# Patient Record
Sex: Female | Born: 1975
Health system: Southern US, Community
[De-identification: ages and names within clinical notes are randomized; demographics above are authoritative.]

## PROBLEM LIST (undated history)

## (undated) DIAGNOSIS — F419 Anxiety disorder, unspecified: Secondary | ICD-10-CM

## (undated) DIAGNOSIS — E0500 Thyrotoxicosis with diffuse goiter without thyrotoxic crisis or storm: Secondary | ICD-10-CM

## (undated) DIAGNOSIS — Z9889 Other specified postprocedural states: Secondary | ICD-10-CM

## (undated) DIAGNOSIS — R519 Headache, unspecified: Secondary | ICD-10-CM

## (undated) DIAGNOSIS — R112 Nausea with vomiting, unspecified: Secondary | ICD-10-CM

## (undated) DIAGNOSIS — R21 Rash and other nonspecific skin eruption: Secondary | ICD-10-CM

## (undated) DIAGNOSIS — R896 Abnormal cytological findings in specimens from other organs, systems and tissues: Secondary | ICD-10-CM

## (undated) DIAGNOSIS — I10 Essential (primary) hypertension: Secondary | ICD-10-CM

## (undated) DIAGNOSIS — Z9289 Personal history of other medical treatment: Secondary | ICD-10-CM

## (undated) DIAGNOSIS — M542 Cervicalgia: Secondary | ICD-10-CM

## (undated) DIAGNOSIS — G8929 Other chronic pain: Secondary | ICD-10-CM

## (undated) DIAGNOSIS — R51 Headache: Secondary | ICD-10-CM

## (undated) DIAGNOSIS — N906 Unspecified hypertrophy of vulva: Secondary | ICD-10-CM

## (undated) DIAGNOSIS — R42 Dizziness and giddiness: Secondary | ICD-10-CM

## (undated) HISTORY — PX: BREAST ENHANCEMENT SURGERY: SHX7

## (undated) HISTORY — PX: CERVICAL CONE BIOPSY: SUR198

## (undated) HISTORY — DX: Abnormal cytological findings in specimens from other organs, systems and tissues: R89.6

---
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
## 1999-03-07 ENCOUNTER — Other Ambulatory Visit: Admission: RE | Admit: 1999-03-07 | Discharge: 1999-03-07 | Payer: Self-pay | Admitting: Obstetrics and Gynecology

## 1999-03-09 DIAGNOSIS — Z8619 Personal history of other infectious and parasitic diseases: Secondary | ICD-10-CM

## 1999-03-09 HISTORY — DX: Personal history of other infectious and parasitic diseases: Z86.19

## 1999-07-20 ENCOUNTER — Encounter: Payer: Self-pay | Admitting: *Deleted

## 1999-07-20 ENCOUNTER — Ambulatory Visit (HOSPITAL_COMMUNITY): Admission: RE | Admit: 1999-07-20 | Discharge: 1999-07-20 | Payer: Self-pay | Admitting: *Deleted

## 1999-09-24 ENCOUNTER — Inpatient Hospital Stay (HOSPITAL_COMMUNITY): Admission: AD | Admit: 1999-09-24 | Discharge: 1999-09-26 | Payer: Self-pay | Admitting: *Deleted

## 2004-03-16 ENCOUNTER — Inpatient Hospital Stay (HOSPITAL_COMMUNITY): Admission: EM | Admit: 2004-03-16 | Discharge: 2004-03-18 | Payer: Self-pay | Admitting: Emergency Medicine

## 2005-09-29 ENCOUNTER — Other Ambulatory Visit: Admission: RE | Admit: 2005-09-29 | Discharge: 2005-09-29 | Payer: Self-pay | Admitting: Obstetrics and Gynecology

## 2006-10-12 ENCOUNTER — Other Ambulatory Visit: Admission: RE | Admit: 2006-10-12 | Discharge: 2006-10-12 | Payer: Self-pay | Admitting: Obstetrics and Gynecology

## 2007-07-08 ENCOUNTER — Encounter (HOSPITAL_COMMUNITY): Admission: RE | Admit: 2007-07-08 | Discharge: 2007-08-28 | Payer: Self-pay | Admitting: Internal Medicine

## 2009-12-10 ENCOUNTER — Emergency Department (HOSPITAL_COMMUNITY): Admission: EM | Admit: 2009-12-10 | Discharge: 2009-12-10 | Payer: Self-pay | Admitting: Emergency Medicine

## 2010-11-16 ENCOUNTER — Encounter (HOSPITAL_COMMUNITY)
Admission: RE | Admit: 2010-11-16 | Discharge: 2011-01-03 | Payer: Self-pay | Source: Home / Self Care | Attending: Endocrinology | Admitting: Endocrinology

## 2010-12-26 ENCOUNTER — Encounter (HOSPITAL_BASED_OUTPATIENT_CLINIC_OR_DEPARTMENT_OTHER): Payer: Self-pay | Admitting: Internal Medicine

## 2011-01-26 ENCOUNTER — Inpatient Hospital Stay (INDEPENDENT_AMBULATORY_CARE_PROVIDER_SITE_OTHER)
Admission: RE | Admit: 2011-01-26 | Discharge: 2011-01-26 | Disposition: A | Discharge status: Home / Self Care | Payer: 59 | Source: Ambulatory Visit | Attending: Family Medicine | Admitting: Family Medicine

## 2011-01-26 DIAGNOSIS — J020 Streptococcal pharyngitis: Secondary | ICD-10-CM

## 2011-01-26 LAB — POCT RAPID STREP A (OFFICE): Streptococcus, Group A Screen (Direct): POSITIVE — AB

## 2011-02-01 ENCOUNTER — Ambulatory Visit (HOSPITAL_COMMUNITY): Payer: 59 | Attending: Internal Medicine

## 2011-02-01 ENCOUNTER — Encounter: Payer: Self-pay | Admitting: Cardiology

## 2011-02-01 ENCOUNTER — Encounter: Payer: Self-pay | Admitting: Cardiovascular Disease

## 2011-02-01 DIAGNOSIS — R072 Precordial pain: Secondary | ICD-10-CM | POA: Insufficient documentation

## 2011-02-01 DIAGNOSIS — I10 Essential (primary) hypertension: Secondary | ICD-10-CM | POA: Insufficient documentation

## 2011-02-01 DIAGNOSIS — E0500 Thyrotoxicosis with diffuse goiter without thyrotoxic crisis or storm: Secondary | ICD-10-CM | POA: Insufficient documentation

## 2011-02-13 LAB — HCG, SERUM, QUALITATIVE: Preg, Serum: NEGATIVE

## 2011-02-19 LAB — COMPREHENSIVE METABOLIC PANEL
ALT: 28 U/L (ref 0–35)
AST: 27 U/L (ref 0–37)
Albumin: 3.4 g/dL — ABNORMAL LOW (ref 3.5–5.2)
CO2: 24 mEq/L (ref 19–32)
Calcium: 8.3 mg/dL — ABNORMAL LOW (ref 8.4–10.5)
GFR calc Af Amer: >60 mL/min (ref >60)
GFR calc non Af Amer: >60 mL/min (ref >60)
Sodium: 138 mEq/L (ref 135–145)

## 2011-02-19 LAB — CBC
Hemoglobin: 13.2 g/dL (ref 12.0–15.0)
MCHC: 35 g/dL (ref 30.0–36.0)
Platelets: 192 10*3/uL (ref 150–400)
RDW: 13.2 % (ref 11.5–15.5)

## 2011-02-19 LAB — T4, FREE: Free T4: 4.08 ng/dL — ABNORMAL HIGH (ref 0.80–1.80)

## 2011-02-19 LAB — RAPID URINE DRUG SCREEN, HOSP PERFORMED
Benzodiazepines: NOT DETECTED
Tetrahydrocannabinol: POSITIVE — AB

## 2011-02-19 LAB — POCT I-STAT, CHEM 8
BUN: 21 mg/dL (ref 6–23)
Calcium, Ion: 1.16 mmol/L (ref 1.12–1.32)
Chloride: 107 mEq/L (ref 96–112)
Glucose, Bld: 205 mg/dL — ABNORMAL HIGH (ref 70–99)

## 2011-02-19 LAB — URINE MICROSCOPIC-ADD ON

## 2011-02-19 LAB — URINALYSIS, ROUTINE W REFLEX MICROSCOPIC
Bilirubin Urine: NEGATIVE
Hgb urine dipstick: NEGATIVE
Nitrite: NEGATIVE
Specific Gravity, Urine: 1.031 — ABNORMAL HIGH (ref 1.005–1.030)
pH: 5.5 (ref 5.0–8.0)

## 2011-02-19 LAB — DIFFERENTIAL
Basophils Absolute: 0 10*3/uL (ref 0.0–0.1)
Basophils Relative: 0 % (ref 0–1)
Lymphocytes Relative: 12 % (ref 12–46)
Neutro Abs: 8.1 10*3/uL — ABNORMAL HIGH (ref 1.7–7.7)
Neutrophils Relative %: 84 % — ABNORMAL HIGH (ref 43–77)

## 2011-02-19 LAB — T4: T4, Total: 15.5 ug/dL — ABNORMAL HIGH (ref 5.0–12.5)

## 2011-04-21 NOTE — H&P (Signed)
Sharon Jones, Sharon Jones NO.:  [id number]   MEDICAL RECORD NO.:  [id number]                   PATIENT TYPE:  INP   LOCATION:  1831                                 FACILITY:  [facility]   PHYSICIAN:  Lanier Ensign, M.D.            DATE OF BIRTH:  07-26-1976   DATE OF ADMISSION:  03/16/2004  DATE OF DISCHARGE:                                HISTORY & PHYSICAL   PRIMARY CARE Jermie Hippe:  Teresita Madura, M.D., presumably in [place] where  the patient lives.  GYN is Dillard Cannon, M.D.   CHIEF COMPLAINT:  Nausea and vomiting.   The patient is a 35 year old white female with a history only of  precancerous cervical cells, status post conization over the past year x2,  without other past medical history, who presents to emergency room with the  abrupt onset of nausea, vomiting, dizziness, orthostatic symptoms, minimal  brown loose stool x5 hours after eating [place] food take-out at 8 p.m.  No  other sick contacts.  Describes subjective chills, no fever, mild anxiety  shortness of breath when symptoms of dizziness come on, no chest pain, and  feeling improved after therapeutic interventions in the emergency room.   PAST MEDICAL HISTORY:  Abnormal Pap smears beginning one year ago with  precancerous cells diagnosed.  The patient was scheduled to follow up with  GYN for evaluation but has missed appointment.   PAST SURGICAL HISTORY:  Conization procedures at [facility].  Breast augmentation two years ago.   No known drug allergies.   MEDICATIONS:  None.  No oral contraceptive pills and no over-the-counter  pills.   FAMILY HISTORY:  Significant for hypertension in father, mother, and sister.   SOCIAL HISTORY:  Lives in [place] with her boyfriend and son.  Tobacco of  one pack per week of cigarettes.  Weekend alcohol consumption of  approximately one bottle of wine at a time.  No drug use.  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   Other review of systems negative.   PHYSICAL EXAMINATION:  VITAL SIGNS:  Most recent blood pressure is 120/60  lying, pulse 100, with standing 79/22, pulse 132, oxygenation 100%.  The  patient is afebrile.  GENERAL:  The patient is a well-developed, well-nourished, mildly ill-  appearing white female in no acute distress.  HEENT:  Normocephalic, atraumatic.  Pupils equal, round, and reactive to  light and accommodation.  Sclerae white.  Conjunctivae clear.  EOMs intact.  Oropharynx clear.  Mucous membranes moist.  No exudate.  No erythema.  Tympanic membranes clear bilaterally.  NECK:  Supple, no lymphadenopathy, no thyromegaly.  CARDIAC:  Tachycardic, regular rhythm.  No murmurs, rubs, or gallops.  CHEST:  Lungs clear to auscultation, good air movement, no crackles or  wheezing.  ABDOMEN:  Mild epigastric tenderness with deep palpation.  Positive bowel  sounds,  soft, nondistended, no hepatosplenomegaly.  EXTREMITIES:  Lower extremities without edema.  2+ DTRs, symmetric.  Strength 5/5, symmetric upper and lower extremities.  NEUROLOGIC:  The patient is alert and oriented, answers questions  appropriately.  Neurologic exam nonfocal.   Lab work obtained showed sodium 141, potassium 3.4, BUN 16, creatinine 0.7.  H&H 13.6 and 40.  On metabolic panel, anion gap is upper limits of normal at  12.  On urinalysis, negative except for 15 ketones.  Urine pregnancy test  negative.   ASSESSMENT AND PLAN:  1. Volume depletion and mild hypokalemia.  Continue IV fluids, replace     electrolytes, monitor orthostatics.  As patient consistently tachycardic,     will monitor on telemetry for now, likely discontinue later on today.  2. Gastroenteritis, food poisoning.  The patient's symptoms except for     orthostasis are improved currently, no nausea or vomiting.  Continue to     treat symptomatically.  Add Protonix and Phenergan for nausea.  3.  Precancerous cervical cells in past.  The patient should be encouraged to     keep follow-up appointment with GYN.                                                Lanier Ensign, M.D.    MHP/MEDQ  D:  03/16/2004  T:  03/16/2004  Job:  [id number]

## 2011-04-21 NOTE — Discharge Summary (Signed)
Sharon Jones, Sharon Jones NO.:  [id number]   MEDICAL RECORD NO.:  [id number]                   PATIENT TYPE:  INP   LOCATION:  4713                                 FACILITY:  [facility]   PHYSICIAN:  Elliot Cousin, M.D.                 DATE OF BIRTH:  June 01, 1976   DATE OF ADMISSION:  03/16/2004  DATE OF DISCHARGE:  03/18/2004                                 DISCHARGE SUMMARY   DISCHARGE DIAGNOSES:  1. C. Diff colitis.  2. Hypokalemia.  3. Normocytic anemia.  4. History of abnormal Pap smear with precancerous cervical cells, status     post conization approximately two years ago.  5. History of breast augmentation approximately two years ago.   DISCHARGE MEDICATIONS:  1. Flagyl 500 mg t.i.d. until completed.  2. Phenergan 25 mg one tablet q.4-6h as needed for nausea.  3. Multivitamin with iron one daily.   DISCHARGE DISPOSITION:  The patient was discharged to home on March 18, 2004, in improved and stable condition.  She was advised to follow up with  her primary care physician, Dr. Teresita Madura, in [place], [place],  in one week to ten days.   HISTORY OF PRESENT ILLNESS:  The patient is a 35 year old lady with a past  medical history significant for only precancerous cervical cells, status  post conization over the past two years without any other past medical  history, who presented to the emergency department on March 16, 2004, with  an abrupt onset of nausea, vomiting, dizziness, orthostatic symptoms and  diarrhea.  The patient stated that these symptoms started approximately five  hours after eating [place] food.  There have been no other sick contacts.  She describes subjective chills, however, no fever.  She denied any  hematemesis, bright red blood per rectum or melena.  The patient was unable  to keep any food down, therefore, she was admitted for further evaluation  and management.   HOSPITAL COURSE:  1. C. DIFF COLITIS.  The  initial management started with volume repletion     with normal saline at 250 cc an hour times three liters.  She was     initially kept NPO, however, shortly after admission she was advanced to     a clear liquid diet.  She was started on Protonix empirically at 40 mg IV     daily.  She was treated with Phenergan 12.5 mg IV every four hours as     needed for nausea.  She did not complain specifically of pain.  Her     initial lab results were significant for a white blood cell count of     12.4, with 11.2 from the absolute neutrophil count.  Her potassium was     3.4, however, her sodium was within normal limits at 141.  Her pregnancy     test was negative.  Her urinalysis  was negative with exception of 15     ketones.  Blood cultures and stool cultures were sent for further     evaluation.   The patient's stool became positive for C. Diff toxin.  She was, therefore,  started on Flagyl 500 mg t.i.d.  Her blood cultures remained negative during  the hospital course.  Approximately 24 hours after treatment, the patient's  nausea, vomiting and diarrhea subsided substantially.  On hospital day  number two she was advanced to a regular diet.  She had complete resolution  of the nausea, vomiting and diarrhea on hospital day number two.  She was  repleted with potassium chloride by mouth.  Her potassium prior to hospital  discharge was 3.7.  Her white blood cell count had fallen to 5.3 prior to  hospital discharge.  The only other abnormality was that the patient had  mild normocytic anemia with her hemoglobin ranging between 11.1 and 11.7  after volume repletion.  Her MCV ranged between 89 and 90.2.  She is a  menstruating female and she had no evidence of bloody stools.  The patient  will follow up with her primary care physician, Dr. Teresita Madura, in  approximately one week to ten days.  The patient was advised to complete the  course of Flagyl at 500 mg t.i.d. for a total of ten  days.                                                Elliot Cousin, M.D.    DF/MEDQ  D:  03/23/2004  T:  03/24/2004  Job:  [id number]   cc:   Teresita Madura, M.D.  [facility]  [place]

## 2011-08-19 ENCOUNTER — Encounter (HOSPITAL_COMMUNITY): Payer: 59

## 2011-10-04 ENCOUNTER — Encounter (HOSPITAL_COMMUNITY): Payer: 59

## 2011-10-14 ENCOUNTER — Inpatient Hospital Stay (HOSPITAL_COMMUNITY): Admission: RE | Admit: 2011-10-14 | Payer: 59 | Source: Ambulatory Visit

## 2011-11-25 ENCOUNTER — Encounter (HOSPITAL_COMMUNITY): Payer: 59

## 2012-04-10 ENCOUNTER — Encounter: Payer: Self-pay | Admitting: Obstetrics and Gynecology

## 2012-04-11 ENCOUNTER — Ambulatory Visit (INDEPENDENT_AMBULATORY_CARE_PROVIDER_SITE_OTHER): Payer: 59 | Admitting: Obstetrics and Gynecology

## 2012-04-11 ENCOUNTER — Encounter: Payer: Self-pay | Admitting: Obstetrics and Gynecology

## 2012-04-11 VITALS — BP 128/80 | Resp 14 | Ht 66.0 in | Wt 145.0 lb

## 2012-04-11 DIAGNOSIS — N926 Irregular menstruation, unspecified: Secondary | ICD-10-CM

## 2012-04-11 DIAGNOSIS — N390 Urinary tract infection, site not specified: Secondary | ICD-10-CM

## 2012-04-11 DIAGNOSIS — B009 Herpesviral infection, unspecified: Secondary | ICD-10-CM

## 2012-04-11 DIAGNOSIS — D280 Benign neoplasm of vulva: Secondary | ICD-10-CM

## 2012-04-11 LAB — POCT URINALYSIS DIPSTICK
Bilirubin, UA: NEGATIVE
Glucose, UA: NEGATIVE
Ketones, UA: NEGATIVE
Leukocytes, UA: NEGATIVE
Spec Grav, UA: 1.015

## 2012-04-11 NOTE — Progress Notes (Signed)
Odor: no Fever: no Pelvic Pain: no  Itching: no Dyspareunia: no Desires GC/CT: no  Thin: no History of PID: no Desires HIV,RPR,HbsAG: no  Thick: no History of STD: yes Other: pt states she has not had her cycle in 3 months and had 2 negative pregnancy test at home.    Sharon Jones is a 36 y.o. year old female,G3P1021, who presents for a problem visit.  Subjective:  The patient complains of no menstrual cycle for 3 months.  She also complains of a tender burning area at her vulva.  She has a past history of herpes simplex virus type I.she has had a Mirena IUD since December of 2008.  She has a past history of HPV.  Objective:  BP 128/80  Resp 14  Ht 5\' 6"  (1.676 m)  Wt 145 lb (65.772 kg)  BMI 23.40 kg/m2  LMP 01/30/2012   General: alert and cooperative GI: soft, non-tender; bowel sounds normal; no masses,  no organomegaly the patient appears anxious  External genitalia: no lesions appreciated except that she does have a small area that may be a condyloma Vaginal: normal without tenderness, induration or masses and relaxation noted Cervix: normal appearance and IUD string visualized Adnexa: normal bimanual exam Uterus: normal size shape and consistency  Pregnancy test: Negative  UA: Negative  Assessment:  Burning area at the vulva.  No lesions appreciated. Herpes virus type I Menorrhea secondary to her Mirena IUD Possible genital wart  Plan:  We discussed the above diagnosis.  Removal of the wart was offered. The patient elects to observe her only for now.  Return to office prn if symptoms worsen or fail to improve.   Leonard Schwartz M.D.  04/12/2012 7:21 PM

## 2012-08-06 ENCOUNTER — Emergency Department (HOSPITAL_COMMUNITY)
Admission: EM | Admit: 2012-08-06 | Discharge: 2012-08-07 | Disposition: A | Discharge status: Home / Self Care | Payer: 59 | Attending: Emergency Medicine | Admitting: Emergency Medicine

## 2012-08-06 ENCOUNTER — Encounter (HOSPITAL_COMMUNITY): Payer: Self-pay | Admitting: Emergency Medicine

## 2012-08-06 DIAGNOSIS — E0500 Thyrotoxicosis with diffuse goiter without thyrotoxic crisis or storm: Secondary | ICD-10-CM | POA: Insufficient documentation

## 2012-08-06 DIAGNOSIS — I10 Essential (primary) hypertension: Secondary | ICD-10-CM | POA: Insufficient documentation

## 2012-08-06 DIAGNOSIS — M549 Dorsalgia, unspecified: Secondary | ICD-10-CM | POA: Insufficient documentation

## 2012-08-06 DIAGNOSIS — Z87891 Personal history of nicotine dependence: Secondary | ICD-10-CM | POA: Insufficient documentation

## 2012-08-06 HISTORY — DX: Essential (primary) hypertension: I10

## 2012-08-06 HISTORY — DX: Thyrotoxicosis with diffuse goiter without thyrotoxic crisis or storm: E05.00

## 2012-08-06 MED ORDER — PREDNISONE 20 MG PO TABS: 40.0000 mg | ORAL_TABLET | Freq: Every day | ORAL | Status: AC

## 2012-08-06 MED ORDER — PREDNISONE 20 MG PO TABS
60.0000 mg | ORAL_TABLET | Freq: Once | ORAL | Status: AC
  Administered 2012-08-06: 60 mg via ORAL
  Filled 2012-08-06: qty 3

## 2012-08-06 MED ORDER — OXYCODONE-ACETAMINOPHEN 5-325 MG PO TABS: 2.0000 | ORAL_TABLET | ORAL | Status: AC | PRN

## 2012-08-06 MED ORDER — HYDROMORPHONE HCL PF 2 MG/ML IJ SOLN
2.0000 mg | Freq: Once | INTRAMUSCULAR | Status: AC
  Administered 2012-08-06: 2 mg via INTRAMUSCULAR
  Filled 2012-08-06: qty 1

## 2012-08-06 NOTE — ED Provider Notes (Signed)
History     CSN: [id number]  Arrival date & time 08/06/12  2128   First MD Initiated Contact with Patient 08/06/12 2354      Chief Complaint  Patient presents with  . Back Pain    (Consider location/radiation/quality/duration/timing/severity/associated sxs/prior treatment) Patient is a 36 y.o. female presenting with back pain. The history is provided by the patient. No language interpreter was used.  Back Pain  This is a recurrent problem. The current episode started more than 1 week ago. The problem occurs every several days. The problem has been gradually worsening. The pain is associated with no known injury. Pain location: R paraspinal lumbar. The quality of the pain is described as shooting and burning. The pain radiates to the right thigh. The pain is at a severity of 10/10. The pain is severe.   36 year old female coming in with right lower back pain with radiation into her right buttocks. States that the pain is intermittent x 3 months. States that this is the worst it has ever been though. States she has been taking ibuprofen for the pain. States she does have some numbness going down to her left thigh but no weakness to the lower extremity. Denies any injury or falls. Denies any bowel or bladder incontinence or perineal numbness.  Past Medical History  Diagnosis Date  . Abnormal finding on Pap smear, ASCUS 1997  . Graves disease   . Hypertension     Past Surgical History  Procedure Date  . Cervical cone biopsy     twice    No family history on file.  History  Substance Use Topics  . Smoking status: Former Games developer  . Smokeless tobacco: Not on file  . Alcohol Use: Yes    OB History    Grav Para Term Preterm Abortions TAB SAB Ect Mult Living   3 1 1  0 2     1      Review of Systems  Constitutional: Negative.   HENT: Negative.   Eyes: Negative.   Respiratory: Negative.   Cardiovascular: Negative.   Gastrointestinal: Negative.   Musculoskeletal: Positive for  back pain and gait problem.  Neurological: Negative.   Psychiatric/Behavioral: Negative.   All other systems reviewed and are negative.    Allergies  Review of patient's allergies indicates no known allergies.  Home Medications   Current Outpatient Rx  Name Route Sig Dispense Refill  . CYCLOBENZAPRINE HCL 10 MG PO TABS Oral Take 10 mg by mouth as needed. spasm    . IBUPROFEN 800 MG PO TABS Oral Take 800 mg by mouth every 8 (eight) hours as needed. Pain    . ISOTRETINOIN 40 MG PO CAPS Oral Take 40 mg by mouth daily.    . OXYCODONE-ACETAMINOPHEN 5-325 MG PO TABS Oral Take 2 tablets by mouth every 4 (four) hours as needed for pain. 15 tablet 0  . PREDNISONE 20 MG PO TABS Oral Take 2 tablets (40 mg total) by mouth daily. 10 tablet 0    Dispense as written.    BP 158/107  Pulse 84  Temp 98.4 F (36.9 C) (Oral)  Resp 20  Wt 140 lb (63.504 kg)  SpO2 100%  Physical Exam  Nursing note and vitals reviewed. Constitutional: She is oriented to person, place, and time. She appears well-developed and well-nourished.  HENT:  Head: Normocephalic and atraumatic.  Eyes: Conjunctivae and EOM are normal. Pupils are equal, round, and reactive to light.  Neck: Normal range of motion. Neck supple.  Cardiovascular: Normal rate.   Pulmonary/Chest: Effort normal.  Abdominal: Soft.  Musculoskeletal: Normal range of motion. She exhibits tenderness. She exhibits no edema.       Right lower back pain with sciatica.  Neurological: She is alert and oriented to person, place, and time. She has normal reflexes. No cranial nerve deficit. Coordination normal.  Skin: Skin is warm and dry.  Psychiatric: She has a normal mood and affect.    ED Course  Procedures (including critical care time)  Labs Reviewed - No data to display No results found.   1. Back pain       MDM  36 year old female with right lower back pain with radiation into the right buttocks and thigh. Having some right-sided  numbness but no perineal numbness or caught a PICC line symptoms. There is flaccid. Better after Dilaudid IM and 2 her muscles. 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 We will give her a work note for tomorrow. Rx for Percocet and prednisone. Good relief in the ER able to ambulate will followup with her orthopedic Dr. on Friday as planned.         Remi Haggard, NP 08/07/12 0004

## 2012-08-06 NOTE — ED Notes (Signed)
Pt with lower back pain which started today.  Has appointment on Friday with ortho MD for numbness in her left leg for two months.  Pt used ice today and Motrin and flexeril without relief.  No injury reported.

## 2012-08-07 NOTE — ED Provider Notes (Signed)
Medical screening examination/treatment/procedure(s) were performed by non-physician practitioner and as supervising physician I was immediately available for consultation/collaboration.  Khiana Camino, MD 08/07/12 0402 

## 2012-08-13 ENCOUNTER — Other Ambulatory Visit (HOSPITAL_COMMUNITY): Payer: Self-pay | Admitting: Orthopedic Surgery

## 2012-08-13 DIAGNOSIS — M545 Low back pain: Secondary | ICD-10-CM

## 2012-08-16 ENCOUNTER — Other Ambulatory Visit (HOSPITAL_COMMUNITY): Payer: 59

## 2012-08-19 ENCOUNTER — Ambulatory Visit (HOSPITAL_COMMUNITY)
Admission: RE | Admit: 2012-08-19 | Discharge: 2012-08-19 | Disposition: A | Discharge status: Home / Self Care | Payer: 59 | Source: Ambulatory Visit | Attending: Orthopedic Surgery | Admitting: Orthopedic Surgery

## 2012-08-19 DIAGNOSIS — M5137 Other intervertebral disc degeneration, lumbosacral region: Secondary | ICD-10-CM | POA: Insufficient documentation

## 2012-08-19 DIAGNOSIS — M545 Low back pain: Secondary | ICD-10-CM

## 2012-08-19 DIAGNOSIS — R209 Unspecified disturbances of skin sensation: Secondary | ICD-10-CM | POA: Insufficient documentation

## 2012-08-19 DIAGNOSIS — M51379 Other intervertebral disc degeneration, lumbosacral region without mention of lumbar back pain or lower extremity pain: Secondary | ICD-10-CM | POA: Insufficient documentation

## 2012-09-27 ENCOUNTER — Other Ambulatory Visit: Payer: Self-pay | Admitting: Orthopedic Surgery

## 2012-09-27 ENCOUNTER — Other Ambulatory Visit (HOSPITAL_COMMUNITY): Payer: Self-pay | Admitting: Orthopedic Surgery

## 2012-09-27 DIAGNOSIS — M545 Low back pain: Secondary | ICD-10-CM

## 2012-09-28 ENCOUNTER — Other Ambulatory Visit (HOSPITAL_COMMUNITY): Payer: 59

## 2012-09-28 ENCOUNTER — Ambulatory Visit (HOSPITAL_COMMUNITY): Admission: RE | Admit: 2012-09-28 | Payer: 59 | Source: Ambulatory Visit

## 2012-09-30 ENCOUNTER — Ambulatory Visit (HOSPITAL_COMMUNITY)
Admission: RE | Admit: 2012-09-30 | Discharge: 2012-09-30 | Disposition: A | Discharge status: Home / Self Care | Payer: 59 | Source: Ambulatory Visit | Attending: Orthopedic Surgery | Admitting: Orthopedic Surgery

## 2012-09-30 ENCOUNTER — Encounter (HOSPITAL_COMMUNITY): Payer: Self-pay | Admitting: Pharmacy Technician

## 2012-09-30 ENCOUNTER — Other Ambulatory Visit: Payer: Self-pay | Admitting: Orthopedic Surgery

## 2012-09-30 DIAGNOSIS — M545 Low back pain: Secondary | ICD-10-CM

## 2012-09-30 DIAGNOSIS — M5020 Other cervical disc displacement, unspecified cervical region: Secondary | ICD-10-CM | POA: Insufficient documentation

## 2012-09-30 MED ORDER — ASPIRIN 81 MG PO CHEW
CHEWABLE_TABLET | ORAL | Status: AC
  Filled 2012-09-30: qty 1

## 2012-10-01 ENCOUNTER — Other Ambulatory Visit: Payer: Self-pay | Admitting: Orthopedic Surgery

## 2012-10-01 ENCOUNTER — Encounter (HOSPITAL_COMMUNITY): Payer: Self-pay | Admitting: *Deleted

## 2012-10-01 NOTE — Progress Notes (Signed)
Pt doesn't have a cardiologist  Denies heart cath/echo/stress test  Denies ekg or cxr being done within the past yr

## 2012-10-02 ENCOUNTER — Ambulatory Visit (HOSPITAL_COMMUNITY): Payer: 59 | Admitting: Anesthesiology

## 2012-10-02 ENCOUNTER — Ambulatory Visit (HOSPITAL_COMMUNITY)
Admission: RE | Admit: 2012-10-02 | Discharge: 2012-10-02 | Disposition: A | Discharge status: Home / Self Care | Payer: 59 | Source: Ambulatory Visit | Attending: Orthopedic Surgery | Admitting: Orthopedic Surgery

## 2012-10-02 ENCOUNTER — Ambulatory Visit (HOSPITAL_COMMUNITY): Payer: 59

## 2012-10-02 ENCOUNTER — Encounter (HOSPITAL_COMMUNITY): Payer: Self-pay | Admitting: Anesthesiology

## 2012-10-02 ENCOUNTER — Observation Stay (HOSPITAL_COMMUNITY)
Admission: RE | Admit: 2012-10-02 | Discharge: 2012-10-03 | Disposition: A | Discharge status: Home / Self Care | Payer: 59 | Source: Ambulatory Visit | Attending: Orthopedic Surgery | Admitting: Orthopedic Surgery

## 2012-10-02 ENCOUNTER — Encounter (HOSPITAL_COMMUNITY): Payer: Self-pay

## 2012-10-02 ENCOUNTER — Encounter (HOSPITAL_COMMUNITY)
Admission: RE | Disposition: A | Discharge status: Home / Self Care | Payer: Self-pay | Source: Ambulatory Visit | Attending: Orthopedic Surgery

## 2012-10-02 ENCOUNTER — Observation Stay (HOSPITAL_COMMUNITY): Payer: 59

## 2012-10-02 DIAGNOSIS — I10 Essential (primary) hypertension: Secondary | ICD-10-CM | POA: Insufficient documentation

## 2012-10-02 DIAGNOSIS — F411 Generalized anxiety disorder: Secondary | ICD-10-CM | POA: Insufficient documentation

## 2012-10-02 DIAGNOSIS — M5000 Cervical disc disorder with myelopathy, unspecified cervical region: Principal | ICD-10-CM | POA: Insufficient documentation

## 2012-10-02 DIAGNOSIS — E0500 Thyrotoxicosis with diffuse goiter without thyrotoxic crisis or storm: Secondary | ICD-10-CM | POA: Insufficient documentation

## 2012-10-02 DIAGNOSIS — G8929 Other chronic pain: Secondary | ICD-10-CM | POA: Insufficient documentation

## 2012-10-02 HISTORY — DX: Other chronic pain: G89.29

## 2012-10-02 HISTORY — PX: ANTERIOR CERVICAL DECOMP/DISCECTOMY FUSION: SHX1161

## 2012-10-02 HISTORY — DX: Other specified postprocedural states: Z98.890

## 2012-10-02 HISTORY — DX: Dizziness and giddiness: R42

## 2012-10-02 HISTORY — DX: Cervicalgia: M54.2

## 2012-10-02 HISTORY — DX: Rash and other nonspecific skin eruption: R21

## 2012-10-02 HISTORY — DX: Nausea with vomiting, unspecified: R11.2

## 2012-10-02 HISTORY — DX: Anxiety disorder, unspecified: F41.9

## 2012-10-02 HISTORY — DX: Personal history of other medical treatment: Z92.89

## 2012-10-02 LAB — URINALYSIS, ROUTINE W REFLEX MICROSCOPIC
Glucose, UA: NEGATIVE mg/dL
Ketones, ur: NEGATIVE mg/dL
Leukocytes, UA: NEGATIVE
Protein, ur: NEGATIVE mg/dL
Urobilinogen, UA: 0.2 mg/dL (ref 0.0–1.0)

## 2012-10-02 LAB — CBC WITH DIFFERENTIAL/PLATELET
Basophils Absolute: 0 10*3/uL (ref 0.0–0.1)
Basophils Relative: 0 % (ref 0–1)
Eosinophils Absolute: 0.2 10*3/uL (ref 0.0–0.7)
Eosinophils Relative: 3 % (ref 0–5)
HCT: 39.6 % (ref 36.0–46.0)
MCHC: 34.8 g/dL (ref 30.0–36.0)
MCV: 90 fL (ref 78.0–100.0)
Monocytes Absolute: 0.5 10*3/uL (ref 0.1–1.0)
Platelets: 264 10*3/uL (ref 150–400)
RDW: 12.3 % (ref 11.5–15.5)
WBC: 8 10*3/uL (ref 4.0–10.5)

## 2012-10-02 LAB — COMPREHENSIVE METABOLIC PANEL
ALT: 18 U/L (ref 0–35)
AST: 18 U/L (ref 0–37)
Albumin: 4.3 g/dL (ref 3.5–5.2)
CO2: 25 mEq/L (ref 19–32)
Calcium: 9.6 mg/dL (ref 8.4–10.5)
Creatinine, Ser: 0.6 mg/dL (ref 0.50–1.10)
GFR calc non Af Amer: >90 mL/min (ref >90)
Sodium: 139 mEq/L (ref 135–145)
Total Protein: 7.8 g/dL (ref 6.0–8.3)

## 2012-10-02 LAB — TYPE AND SCREEN
ABO/RH(D): A POS
Antibody Screen: NEGATIVE

## 2012-10-02 LAB — URINE MICROSCOPIC-ADD ON

## 2012-10-02 LAB — PROTIME-INR: INR: 1.04 (ref 0.00–1.49)

## 2012-10-02 LAB — ABO/RH: ABO/RH(D): A POS

## 2012-10-02 SURGERY — ANTERIOR CERVICAL DECOMPRESSION/DISCECTOMY FUSION 1 LEVEL
Anesthesia: General | Site: Spine Cervical | Laterality: Bilateral | Wound class: Clean

## 2012-10-02 MED ORDER — PROPOFOL INFUSION 10 MG/ML OPTIME
INTRAVENOUS | Status: DC | PRN
  Administered 2012-10-02: 100 ug/kg/min via INTRAVENOUS

## 2012-10-02 MED ORDER — DEXTROSE 5 % IV SOLN
INTRAVENOUS | Status: DC | PRN
  Administered 2012-10-02: 15:00:00 via INTRAVENOUS

## 2012-10-02 MED ORDER — MUPIROCIN 2 % EX OINT
TOPICAL_OINTMENT | CUTANEOUS | Status: AC
  Administered 2012-10-02: 1 via NASAL
  Filled 2012-10-02: qty 22

## 2012-10-02 MED ORDER — CEFAZOLIN SODIUM-DEXTROSE 2-3 GM-% IV SOLR: 2.0000 g | INTRAVENOUS | Status: DC

## 2012-10-02 MED ORDER — ISOTRETINOIN 40 MG PO CAPS: 40.0000 mg | ORAL_CAPSULE | Freq: Every day | ORAL | Status: DC

## 2012-10-02 MED ORDER — SUCCINYLCHOLINE CHLORIDE 20 MG/ML IJ SOLN
INTRAMUSCULAR | Status: DC | PRN
  Administered 2012-10-02: 120 mg via INTRAVENOUS

## 2012-10-02 MED ORDER — SODIUM CHLORIDE 0.9 % IV SOLN: 250.0000 mL | INTRAVENOUS | Status: DC

## 2012-10-02 MED ORDER — MENTHOL 3 MG MT LOZG: 1.0000 | LOZENGE | OROMUCOSAL | Status: DC | PRN

## 2012-10-02 MED ORDER — CEFAZOLIN SODIUM-DEXTROSE 2-3 GM-% IV SOLR
INTRAVENOUS | Status: AC
  Filled 2012-10-02: qty 50

## 2012-10-02 MED ORDER — OXYCODONE HCL 5 MG PO TABS: 5.0000 mg | ORAL_TABLET | Freq: Once | ORAL | Status: DC | PRN

## 2012-10-02 MED ORDER — LACTATED RINGERS IV SOLN
INTRAVENOUS | Status: DC | PRN
  Administered 2012-10-02 (×2): via INTRAVENOUS

## 2012-10-02 MED ORDER — BUPIVACAINE-EPINEPHRINE PF 0.25-1:200000 % IJ SOLN
INTRAMUSCULAR | Status: AC
  Filled 2012-10-02: qty 30

## 2012-10-02 MED ORDER — PHENOL 1.4 % MT LIQD
1.0000 | OROMUCOSAL | Status: DC | PRN
  Filled 2012-10-02: qty 177

## 2012-10-02 MED ORDER — CEFAZOLIN SODIUM-DEXTROSE 2-3 GM-% IV SOLR
2.0000 g | INTRAVENOUS | Status: AC
  Administered 2012-10-02: 2 g via INTRAVENOUS
  Filled 2012-10-02: qty 50

## 2012-10-02 MED ORDER — OXYCODONE-ACETAMINOPHEN 5-325 MG PO TABS
1.0000 | ORAL_TABLET | ORAL | Status: DC | PRN
  Administered 2012-10-02 – 2012-10-03 (×2): 2 via ORAL
  Filled 2012-10-02 (×2): qty 2

## 2012-10-02 MED ORDER — POVIDONE-IODINE 7.5 % EX SOLN
Freq: Once | CUTANEOUS | Status: DC
  Filled 2012-10-02: qty 118

## 2012-10-02 MED ORDER — HYDROMORPHONE HCL PF 1 MG/ML IJ SOLN
0.2500 mg | INTRAMUSCULAR | Status: DC | PRN
  Administered 2012-10-02 (×2): 0.5 mg via INTRAVENOUS

## 2012-10-02 MED ORDER — LIDOCAINE HCL (CARDIAC) 20 MG/ML IV SOLN: INTRAVENOUS | Status: DC | PRN

## 2012-10-02 MED ORDER — ONDANSETRON HCL 4 MG/2ML IJ SOLN: 4.0000 mg | INTRAMUSCULAR | Status: DC | PRN

## 2012-10-02 MED ORDER — OXYCODONE HCL 5 MG/5ML PO SOLN: 5.0000 mg | Freq: Once | ORAL | Status: DC | PRN

## 2012-10-02 MED ORDER — CEFAZOLIN SODIUM 1-5 GM-% IV SOLN
1.0000 g | Freq: Three times a day (TID) | INTRAVENOUS | Status: AC
  Administered 2012-10-02 – 2012-10-03 (×2): 1 g via INTRAVENOUS
  Filled 2012-10-02 (×2): qty 50

## 2012-10-02 MED ORDER — METOCLOPRAMIDE HCL 5 MG/ML IJ SOLN
INTRAMUSCULAR | Status: DC | PRN
  Administered 2012-10-02: 10 mg via INTRAVENOUS

## 2012-10-02 MED ORDER — PROMETHAZINE HCL 25 MG/ML IJ SOLN: 6.2500 mg | INTRAMUSCULAR | Status: DC | PRN

## 2012-10-02 MED ORDER — LIDOCAINE HCL (CARDIAC) 20 MG/ML IV SOLN
INTRAVENOUS | Status: DC | PRN
  Administered 2012-10-02: 50 mg via INTRAVENOUS

## 2012-10-02 MED ORDER — POVIDONE-IODINE 7.5 % EX SOLN: Freq: Once | CUTANEOUS | Status: DC

## 2012-10-02 MED ORDER — PROPOFOL 10 MG/ML IV BOLUS
INTRAVENOUS | Status: DC | PRN
  Administered 2012-10-02: 200 mg via INTRAVENOUS

## 2012-10-02 MED ORDER — DEXAMETHASONE SODIUM PHOSPHATE 4 MG/ML IJ SOLN
INTRAMUSCULAR | Status: DC | PRN
  Administered 2012-10-02: 12 mg via INTRAVENOUS

## 2012-10-02 MED ORDER — MORPHINE SULFATE 2 MG/ML IJ SOLN
2.0000 mg | INTRAMUSCULAR | Status: DC | PRN
  Administered 2012-10-02 – 2012-10-03 (×2): 2 mg via INTRAVENOUS
  Filled 2012-10-02 (×2): qty 1

## 2012-10-02 MED ORDER — THROMBIN 20000 UNITS EX SOLR
OROMUCOSAL | Status: DC | PRN
  Administered 2012-10-02: 16:00:00 via TOPICAL

## 2012-10-02 MED ORDER — ACETAMINOPHEN 325 MG PO TABS: 650.0000 mg | ORAL_TABLET | ORAL | Status: DC | PRN

## 2012-10-02 MED ORDER — SODIUM CHLORIDE 0.9 % IJ SOLN: 3.0000 mL | Freq: Two times a day (BID) | INTRAMUSCULAR | Status: DC

## 2012-10-02 MED ORDER — 0.9 % SODIUM CHLORIDE (POUR BTL) OPTIME
TOPICAL | Status: DC | PRN
  Administered 2012-10-02: 1000 mL

## 2012-10-02 MED ORDER — ALUM & MAG HYDROXIDE-SIMETH 200-200-20 MG/5ML PO SUSP: 30.0000 mL | Freq: Four times a day (QID) | ORAL | Status: DC | PRN

## 2012-10-02 MED ORDER — DOCUSATE SODIUM 100 MG PO CAPS
100.0000 mg | ORAL_CAPSULE | Freq: Two times a day (BID) | ORAL | Status: DC
  Filled 2012-10-02 (×2): qty 1

## 2012-10-02 MED ORDER — SENNA 8.6 MG PO TABS
1.0000 | ORAL_TABLET | Freq: Two times a day (BID) | ORAL | Status: DC
  Filled 2012-10-02 (×3): qty 1

## 2012-10-02 MED ORDER — BUPIVACAINE-EPINEPHRINE 0.25% -1:200000 IJ SOLN
INTRAMUSCULAR | Status: DC | PRN
  Administered 2012-10-02: 2 mL

## 2012-10-02 MED ORDER — LACTATED RINGERS IV SOLN
INTRAVENOUS | Status: DC
  Administered 2012-10-02: 15:00:00 via INTRAVENOUS

## 2012-10-02 MED ORDER — DIAZEPAM 5 MG PO TABS: 5.0000 mg | ORAL_TABLET | Freq: Four times a day (QID) | ORAL | Status: DC | PRN

## 2012-10-02 MED ORDER — POTASSIUM CHLORIDE IN NACL 20-0.9 MEQ/L-% IV SOLN
INTRAVENOUS | Status: DC
  Administered 2012-10-02: 80 mL/h via INTRAVENOUS
  Filled 2012-10-02 (×3): qty 1000

## 2012-10-02 MED ORDER — ZOLPIDEM TARTRATE 5 MG PO TABS
5.0000 mg | ORAL_TABLET | Freq: Every evening | ORAL | Status: DC | PRN
  Administered 2012-10-03: 5 mg via ORAL
  Filled 2012-10-02: qty 1

## 2012-10-02 MED ORDER — SODIUM CHLORIDE 0.9 % IJ SOLN: 3.0000 mL | INTRAMUSCULAR | Status: DC | PRN

## 2012-10-02 MED ORDER — ACETAMINOPHEN 650 MG RE SUPP: 650.0000 mg | RECTAL | Status: DC | PRN

## 2012-10-02 MED ORDER — THROMBIN 20000 UNITS EX SOLR
CUTANEOUS | Status: AC
  Filled 2012-10-02: qty 20000

## 2012-10-02 MED ORDER — HYDROMORPHONE HCL PF 1 MG/ML IJ SOLN
INTRAMUSCULAR | Status: AC
  Filled 2012-10-02: qty 1

## 2012-10-02 MED ORDER — ONDANSETRON HCL 4 MG/2ML IJ SOLN
INTRAMUSCULAR | Status: DC | PRN
  Administered 2012-10-02: 4 mg via INTRAVENOUS

## 2012-10-02 MED ORDER — MIDAZOLAM HCL 5 MG/5ML IJ SOLN
INTRAMUSCULAR | Status: DC | PRN
  Administered 2012-10-02 (×2): 2 mg via INTRAVENOUS

## 2012-10-02 MED ORDER — ARTIFICIAL TEARS OP OINT
TOPICAL_OINTMENT | OPHTHALMIC | Status: DC | PRN
  Administered 2012-10-02: 1 via OPHTHALMIC

## 2012-10-02 MED ORDER — FENTANYL CITRATE 0.05 MG/ML IJ SOLN
INTRAMUSCULAR | Status: DC | PRN
  Administered 2012-10-02: 150 ug via INTRAVENOUS
  Administered 2012-10-02 (×4): 50 ug via INTRAVENOUS
  Administered 2012-10-02 (×2): 100 ug via INTRAVENOUS

## 2012-10-02 SURGICAL SUPPLY — 71 items
APL SKNCLS STERI-STRIP NONHPOA (GAUZE/BANDAGES/DRESSINGS) ×1
BENZOIN TINCTURE PRP APPL 2/3 (GAUZE/BANDAGES/DRESSINGS) ×2 IMPLANT
BIT DRILL NEURO 2X3.1 SFT TUCH (MISCELLANEOUS) ×1 IMPLANT
BLADE SURG 15 STRL LF DISP TIS (BLADE) ×1 IMPLANT
BLADE SURG 15 STRL SS (BLADE) ×2
BLADE SURG ROTATE 9660 (MISCELLANEOUS) ×2 IMPLANT
BUR MATCHSTICK NEURO 3.0 LAGG (BURR) ×2 IMPLANT
CAGE 16X6X14 ENDOSKEL MED (Orthopedic Implant) IMPLANT
CARTRIDGE OIL MAESTRO DRILL (MISCELLANEOUS) ×1 IMPLANT
CLOTH BEACON ORANGE TIMEOUT ST (SAFETY) ×2 IMPLANT
CLSR STERI-STRIP ANTIMIC 1/2X4 (GAUZE/BANDAGES/DRESSINGS) ×1 IMPLANT
COLLAR CERV LO CONTOUR FIRM DE (SOFTGOODS) IMPLANT
CORDS BIPOLAR (ELECTRODE) ×2 IMPLANT
COVER SURGICAL LIGHT HANDLE (MISCELLANEOUS) ×2 IMPLANT
CRADLE DONUT ADULT HEAD (MISCELLANEOUS) ×2 IMPLANT
DIFFUSER DRILL AIR PNEUMATIC (MISCELLANEOUS) ×2 IMPLANT
DRAIN JACKSON RD 7FR 3/32 (WOUND CARE) IMPLANT
DRAPE C-ARM 42X72 X-RAY (DRAPES) ×2 IMPLANT
DRAPE POUCH INSTRU U-SHP 10X18 (DRAPES) ×2 IMPLANT
DRAPE SURG 17X23 STRL (DRAPES) ×6 IMPLANT
DRILL NEURO 2X3.1 SOFT TOUCH (MISCELLANEOUS) ×2
DURAPREP 26ML APPLICATOR (WOUND CARE) ×2 IMPLANT
ELECT COATED BLADE 2.86 ST (ELECTRODE) ×2 IMPLANT
ELECT REM PT RETURN 9FT ADLT (ELECTROSURGICAL) ×2
ELECTRODE REM PT RTRN 9FT ADLT (ELECTROSURGICAL) ×1 IMPLANT
ENDOSKELETON IMPLANT MED 6M-0 (Orthopedic Implant) ×2 IMPLANT
EVACUATOR SILICONE 100CC (DRAIN) IMPLANT
GAUZE SPONGE 4X4 16PLY XRAY LF (GAUZE/BANDAGES/DRESSINGS) ×2 IMPLANT
GLOVE BIO SURGEON STRL SZ8 (GLOVE) ×2 IMPLANT
GLOVE BIOGEL PI IND STRL 8 (GLOVE) ×1 IMPLANT
GLOVE BIOGEL PI INDICATOR 8 (GLOVE) ×1
GOWN SRG XL XLNG 56XLVL 4 (GOWN DISPOSABLE) ×1 IMPLANT
GOWN STRL NON-REIN LRG LVL3 (GOWN DISPOSABLE) ×2 IMPLANT
GOWN STRL NON-REIN XL XLG LVL4 (GOWN DISPOSABLE) ×2
IV CATH 14GX2 1/4 (CATHETERS) ×2 IMPLANT
KIT BASIN OR (CUSTOM PROCEDURE TRAY) ×2 IMPLANT
KIT ROOM TURNOVER OR (KITS) ×2 IMPLANT
MANIFOLD NEPTUNE II (INSTRUMENTS) ×2 IMPLANT
NDL SPNL 20GX3.5 QUINCKE YW (NEEDLE) ×1 IMPLANT
NEEDLE 27GAX1X1/2 (NEEDLE) ×2 IMPLANT
NEEDLE SPNL 20GX3.5 QUINCKE YW (NEEDLE) ×2 IMPLANT
NS IRRIG 1000ML POUR BTL (IV SOLUTION) ×2 IMPLANT
OIL CARTRIDGE MAESTRO DRILL (MISCELLANEOUS) ×2
PACK ORTHO CERVICAL (CUSTOM PROCEDURE TRAY) ×2 IMPLANT
PAD ARMBOARD 7.5X6 YLW CONV (MISCELLANEOUS) ×4 IMPLANT
PATTIES SURGICAL .5 X.5 (GAUZE/BANDAGES/DRESSINGS) ×1 IMPLANT
PATTIES SURGICAL .5 X1 (DISPOSABLE) ×1 IMPLANT
PIN DISTRACTION 14 (PIN) ×2 IMPLANT
PLATE LV 1 12MM (Plate) ×1 IMPLANT
PUTTY BONE DBX 2.5 MIS (Bone Implant) ×1 IMPLANT
SCREW 4.0X16MM (Screw) ×4 IMPLANT
SPONGE GAUZE 4X4 12PLY (GAUZE/BANDAGES/DRESSINGS) ×2 IMPLANT
SPONGE INTESTINAL PEANUT (DISPOSABLE) ×2 IMPLANT
SPONGE SURGIFOAM ABS GEL 100 (HEMOSTASIS) ×1 IMPLANT
STRIP CLOSURE SKIN 1/2X4 (GAUZE/BANDAGES/DRESSINGS) ×2 IMPLANT
SURGIFLO TRUKIT (HEMOSTASIS) IMPLANT
SUT MNCRL AB 4-0 PS2 18 (SUTURE) ×1 IMPLANT
SUT SILK 4 0 (SUTURE)
SUT SILK 4-0 18XBRD TIE 12 (SUTURE) IMPLANT
SUT VIC AB 1 CT1 27 (SUTURE) ×2
SUT VIC AB 1 CT1 27XBRD ANBCTR (SUTURE) ×1 IMPLANT
SUT VIC AB 2-0 CT2 18 VCP726D (SUTURE) ×2 IMPLANT
SYR BULB IRRIGATION 50ML (SYRINGE) ×2 IMPLANT
SYR CONTROL 10ML LL (SYRINGE) ×4 IMPLANT
TAPE CLOTH 4X10 WHT NS (GAUZE/BANDAGES/DRESSINGS) ×2 IMPLANT
TAPE CLOTH SURG 4X10 WHT LF (GAUZE/BANDAGES/DRESSINGS) ×1 IMPLANT
TAPE UMBILICAL COTTON 1/8X30 (MISCELLANEOUS) ×2 IMPLANT
TOWEL OR 17X24 6PK STRL BLUE (TOWEL DISPOSABLE) ×2 IMPLANT
TOWEL OR 17X26 10 PK STRL BLUE (TOWEL DISPOSABLE) ×2 IMPLANT
WATER STERILE IRR 1000ML POUR (IV SOLUTION) ×2 IMPLANT
YANKAUER SUCT BULB TIP NO VENT (SUCTIONS) ×2 IMPLANT

## 2012-10-02 NOTE — Preoperative (Signed)
Beta Blockers   Reason not to administer Beta Blockers:Not Applicable 

## 2012-10-02 NOTE — Anesthesia Procedure Notes (Addendum)
Procedure Name: Intubation Date/Time: 10/02/2012 3:15 PM Performed by: Wray Kearns A Pre-anesthesia Checklist: Patient identified, Timeout performed, Emergency Drugs available, Suction available and Patient being monitored Patient Re-evaluated:Patient Re-evaluated prior to inductionOxygen Delivery Method: Circle system utilized Preoxygenation: Pre-oxygenation with 100% oxygen Intubation Type: IV induction and Cricoid Pressure applied Ventilation: Mask ventilation without difficulty Laryngoscope Size: Mac Grade View: Grade I Tube type: Oral Number of attempts: 1 Airway Equipment and Method: Stylet and Video-laryngoscopy Placement Confirmation: ETT inserted through vocal cords under direct vision,  breath sounds checked- equal and bilateral and positive ETCO2 Secured at: 22 cm Tube secured with: Tape Dental Injury: Teeth and Oropharynx as per pre-operative assessment  Comments: In-line manual traction during induction per Dr. Lenore Cordia neck in neutral position; same as pre-op when Pt stated she was comfortable.

## 2012-10-02 NOTE — Transfer of Care (Signed)
Immediate Anesthesia Transfer of Care Note  Patient: Sharon Jones  Procedure(s) Performed: Procedure(s) (LRB) with comments: ANTERIOR CERVICAL DECOMPRESSION/DISCECTOMY FUSION 1 LEVEL (Bilateral) - Anterior cervical decompression fusion, 5-6 with instrumentation, allograft.  Patient Location: PACU  Anesthesia Type:General  Level of Consciousness: awake and alert   Airway & Oxygen Therapy: Patient Spontanous Breathing and Patient connected to nasal cannula oxygen  Post-op Assessment: Report given to PACU RN and Post -op Vital signs reviewed and stable  Post vital signs: Reviewed and stable  Complications: No apparent anesthesia complications

## 2012-10-02 NOTE — Anesthesia Preprocedure Evaluation (Addendum)
Anesthesia Evaluation  Patient identified by MRN, date of birth, ID band Patient awake    Reviewed: Allergy & Precautions, H&P , NPO status , Patient's Chart, lab work & pertinent test results, reviewed documented beta blocker date and time   History of Anesthesia Complications (+) PONV  Airway Mallampati: II TM Distance: >3 FB Neck ROM: Full    Dental  (+) Teeth Intact and Dental Advisory Given   Pulmonary neg pulmonary ROS,    Pulmonary exam normal       Cardiovascular hypertension,     Neuro/Psych  Headaches, Anxiety    GI/Hepatic negative GI ROS, Neg liver ROS,   Endo/Other  Hyperthyroidism   Renal/GU negative Renal ROS     Musculoskeletal   Abdominal   Peds  Hematology negative hematology ROS (+)   Anesthesia Other Findings   Reproductive/Obstetrics                          Anesthesia Physical Anesthesia Plan  ASA: III and Emergent  Anesthesia Plan: General   Post-op Pain Management:    Induction: Intravenous  Airway Management Planned: Video Laryngoscope Planned  Additional Equipment:   Intra-op Plan:   Post-operative Plan: Extubation in OR  Informed Consent: I have reviewed the patients History and Physical, chart, labs and discussed the procedure including the risks, benefits and alternatives for the proposed anesthesia with the patient or authorized representative who has indicated his/her understanding and acceptance.   Dental advisory given  Plan Discussed with: CRNA, Anesthesiologist and Surgeon  Anesthesia Plan Comments:         Anesthesia Quick Evaluation

## 2012-10-02 NOTE — Progress Notes (Signed)
Call to Dr. Krista Blue- reported BP & pt. History of noncompliance to anti-htn med.  Reported also that pt had ECHO /w Dr. Elease Hashimoto 2 + yrs. Ago, that I had called [facility] for the record & I was still waiting for fax.

## 2012-10-02 NOTE — H&P (Signed)
PREOPERATIVE H&P  Chief Complaint: balance deterioration  HPI: Sharon Jones is a 36 y.o. female who presents with deterioration in balance and fine motor skills  Past Medical History  Diagnosis Date  . Abnormal finding on Pap smear, ASCUS 1997  . Graves disease   . PONV (postoperative nausea and vomiting)   . Hypertension     doesn't require meds  . Headache     occaionally  . Dizziness   . Chronic neck pain     bulding disc  . Rash     arms and feet  . Muscle spasms of head and/or neck     takes Flexeril prn  . Anxiety     doesn't require meds   Past Surgical History  Procedure Date  . Cervical cone biopsy     twice  . Breast enhancement surgery    History   Social History  . Marital Status: Single    Spouse Name: N/A    Number of Children: N/A  . Years of Education: N/A   Social History Main Topics  . Smoking status: Former Games developer  . Smokeless tobacco: None   Comment: quit in mar 2013-uses EC cigarette  . Alcohol Use: Yes     more frequently than occaionally  . Drug Use: No  . Sexually Active: Yes    Birth Control/ Protection: IUD   Other Topics Concern  . None   Social History Narrative  . None   History reviewed. No pertinent family history. No Known Allergies Prior to Admission medications   Medication Sig Start Date End Date Taking? Authorizing Provider  cyclobenzaprine (FLEXERIL) 10 MG tablet Take 10 mg by mouth 2 (two) times daily as needed. For muscle spasms.   Yes Historical Provider, MD  ISOtretinoin (CLARAVIS) 40 MG capsule Take 40 mg by mouth daily.   Yes Historical Provider, MD     All other systems have been reviewed and were otherwise negative with the exception of those mentioned in the HPI and as above.  Physical Exam: There were no vitals filed for this visit.  General: Alert, no acute distress Cardiovascular: No pedal edema Respiratory: No cyanosis, no use of accessory musculature GI: No organomegaly, abdomen is soft and  non-tender Skin: No lesions in the area of chief complaint Neurologic: Sensation intact distally Psychiatric: Patient is competent for consent with normal mood and affect Lymphatic: No axillary or cervical lymphadenopathy  MUSCULOSKELETAL: + hoffman's bilaterally, hyperreflexia, + rhomberg  Assessment/Plan: Profound cervical myelopathy with very large C5/6 CNP Plan for Procedure(s): ANTERIOR CERVICAL DECOMPRESSION/DISCECTOMY FUSION 1 LEVEL   Emilee Hero, MD 10/02/2012 6:31 AM

## 2012-10-02 NOTE — Progress Notes (Signed)
Pt.'s BP elevated, pt.  Reports that she has been given Benicar in the past but pt. reports, " I fill it & take it when I feel the buzzing".  Pt. Reports that she hasn't taken it in about a couple of weeks.

## 2012-10-02 NOTE — Anesthesia Postprocedure Evaluation (Signed)
  Anesthesia Post-op Note  Patient: Sharon Jones  Procedure(s) Performed: Procedure(s) (LRB) with comments: ANTERIOR CERVICAL DECOMPRESSION/DISCECTOMY FUSION 1 LEVEL (Bilateral) - Anterior cervical decompression fusion, 5-6 with instrumentation, allograft.  Patient Location: PACU  Anesthesia Type:General  Level of Consciousness: awake  Airway and Oxygen Therapy: Patient Spontanous Breathing  Post-op Pain: mild  Post-op Assessment: Post-op Vital signs reviewed  Post-op Vital Signs: Reviewed  Complications: No apparent anesthesia complications

## 2012-10-02 NOTE — Anesthesia Postprocedure Evaluation (Signed)
  Anesthesia Post-op Note  Patient: Sharon Jones  Procedure(s) Performed: Procedure(s) (LRB) with comments: ANTERIOR CERVICAL DECOMPRESSION/DISCECTOMY FUSION 1 LEVEL (Bilateral) - Anterior cervical decompression fusion, 5-6 with instrumentation, allograft.  Patient Location: PACU  Anesthesia Type:General  Level of Consciousness: awake  Airway and Oxygen Therapy: Patient Spontanous Breathing and Patient connected to nasal cannula oxygen  Post-op Pain: mild  Post-op Assessment: Post-op Vital signs reviewed, Patient's Cardiovascular Status Stable, Respiratory Function Stable, Patent Airway and No signs of Nausea or vomiting  Post-op Vital Signs: Reviewed and stable  Complications: No apparent anesthesia complications

## 2012-10-03 ENCOUNTER — Encounter (HOSPITAL_COMMUNITY): Payer: Self-pay | Admitting: Orthopedic Surgery

## 2012-10-03 NOTE — Progress Notes (Signed)
Patient doing well, minimal neck pain.  BP 141/109  Pulse 101  Temp 98.4 F (36.9 C) (Oral)  Resp 18  Ht 5\' 5"  (1.651 m)  Wt 65.6 kg (144 lb 10 oz)  BMI 24.07 kg/m2  SpO2 99%  LMP 03/02/2012  NVI Dressing CDI  Pt s/p C5/6 acdf for large disc herniation and myelopathy  - d/c home today - f/u 2 weeks - hard collar at all times - pt advised to f/u with PCP regarding HTN

## 2012-10-03 NOTE — Progress Notes (Signed)
Orthopedic Tech Progress Note Patient Details:  Sharon Jones [birth year] [id number] Nursing discharge orders called for [place] collar. Collar delivered and given to patient with instructions to wear during showers. Ortho Devices Type of Ortho Device: [place] cervical collar Ortho Device/Splint Interventions: Ordered   [place] R Thompson 10/03/2012, 9:40 AM

## 2012-10-03 NOTE — Op Note (Signed)
Sharon Jones, Sharon Jones NO.:  [id number]  MEDICAL RECORD NO.:  [id number]  LOCATION:  5N29C                        FACILITY:  [facility]  PHYSICIAN:  Estill Bamberg, MD      DATE OF BIRTH:  [birth year]  DATE OF PROCEDURE:  10/02/2012 DATE OF DISCHARGE:  10/03/2012                              OPERATIVE REPORT   PREOPERATIVE DIAGNOSES: 1. Severe spinal cord compression with profound symptoms and profound     exam notable for significant myelopathy. 2. Large C5-6 disk herniation causing severe compression of     myelomalacia of the spinal cord.  POSTOPERATIVE DIAGNOSES: 1. Severe spinal cord compression with profound symptoms and profound     exam notable for significant myelopathy. 2. Large C5-6 disk herniation causing severe compression of     myelomalacia of the spinal cord.  PROCEDURES: 1. C5-6 anterior cervical decompression and fusion. 2. Placement of anterior instrumentation C5-6. 3. Insertion of interbody device x1 (small 6 mm parallel tight and     interbody cage). 4. Use of local autograft. 5. Use of morselized allograft. 6. Intraoperative use of fluoroscopy.  SURGEON:  Estill Bamberg, MD  ASSISTANT:  None.  ANESTHESIA:  General endotracheal anesthesia.  COMPLICATIONS:  None.  DISPOSITION:  Stable.  ESTIMATED BLOOD LOSS:  Minimal.  INDICATIONS FOR PROCEDURE:  Briefly, Sharon Jones is a pleasant 36 year old female who presented to me with severe deterioration in her balance and fine motor skills.  Her exam was very much consistent with myelopathy. I did obtain an urgent MRI of her cervical spine which was notable for a very large C5-6 disk herniation causing obvious and severe compression of the spinal cord.  Given the natural history of myelopathy, I did discuss with the patient regarding going forward with a C5-6 ACDF instrumentation.  The patient fully understood the risks and limitations of the procedure.  Of particular note, the patient did  understand that the goal of surgery was not to reverse her constellation of symptoms, but to prevent additional progression.  The patient also did understand the risk of spinal cord injury.  OPERATIVE DETAILS:  On October 02, 2012, the patient was brought to the Surgery and general endotracheal anesthesia was administered.  Of note, a fiberoptic intubation was utilized to ensure that there was no extension of the neck.  Baseline motor-evoked potentials were obtained. The neck was then placed in a gentle degree of extension and the neck was prepped and draped in the usual sterile fashion.  The arms were secured to the patient's sides.  I then obtained an additional motor evoked potential and this was consistent with baseline.  The neck was then prepped and draped in the usual sterile fashion.  I then made a transverse incision over the C5-6 interspace.  The platysma was sharply incised.  The plane between the sternocleidomastoid muscle laterally and the strap muscles medially that was explored, and the anterior cervical spine was readily noted.  I then obtained an additional intraoperative lateral fluoroscopic view to confirm the appropriate operative level.  A vertebral bodies of C5 and C6  approximately exposed.  I then placed Caspar pins into the C5 and C6 vertebral bodies and gentle distraction  was applied across the C5-6 interspace.  I then went forward with a meticulous diskectomy using a series of curettes and Kerrison punches. On with the posterior border of the C5 and C6 vertebral bodies, it was obvious that there was ongoing compression of the spinal cord with residual disk protrusions posteriorly.  I did meticulously using a micro nerve hook to tease away these protrusions and I was ultimately able to remove the entirety of the posterior aspect of the intervertebral disk. The anterior dura was noted and was noted to be free of compression.  I did significantly undermined the  posterior aspect of the C5 and C6 vertebral bodies, as there was residual disk material located behind the vertebral bodies.  I was able to confirmatory obtain a decompression of the spinal canal.  I then prepared the endplates and placed a series of trials and I did select a 6 mm small parallel trial which was packed with DBX mix.  Autograft obtained for removing osteophytes anteriorly, and were also packed into the interbody device and this was tamped into position at under distraction.  Distraction was then discontinued and the Caspar pins were removed.  Bone wax was placed into place.  I then chose a 12 mm Synthes Vectra plate.  A 16 mm self-drilling, self-tapping screws were placed, 2 in each vertebral body.  I was very pleased with the final purchase of each of the screws.  The wound was then copiously irrigated.  All bleeding was controlled using bipolar electrocautery. The platysma was then closed using 2-0 Vicryl.  The skin was then closed using 3-0 Monocryl.  Benzoin and Steri-Strips for applied followed by sterile dressing.  All instrument counts were correct at the termination of the procedure.  Of note, motor-evoked potentials were performed throughout the surgery intermittently and there was no change in the baseline throughout the surgery.     Estill Bamberg, MD     MD/MEDQ  D:  10/02/2012  T:  10/03/2012  Job:  [id number]  cc:   Barry Dienes. Eloise Harman, M.D.

## 2012-10-04 NOTE — Discharge Summary (Signed)
Patient ID: Sharon Jones MRN: [id number] DOB/AGE: Feb 13, 1976 36 y.o.  Admit date: 10/02/2012 Discharge date: 10/03/2012  Admission Diagnoses:  myelopathy  Discharge Diagnoses:  Same  Past Medical History  Diagnosis Date  . Abnormal finding on Pap smear, ASCUS 1997  . Graves disease   . PONV (postoperative nausea and vomiting)   . Hypertension     doesn't require meds  . Headache     occaionally  . Dizziness   . Chronic neck pain     bulding disc  . Rash     arms and feet  . Muscle spasms of head and/or neck     takes Flexeril prn  . Anxiety     doesn't require meds  . H/O echocardiogram     pt. consulted /w Dr. Elease Hashimoto, 2 + yrs. ago, had ECHO due to Graves disease.     Surgeries: Procedure(s): ANTERIOR CERVICAL DECOMPRESSION/DISCECTOMY FUSION 1 LEVEL on 10/02/2012   Discharged Condition: Improved  Hospital Course: Sharon Jones is an 36 y.o. female who was admitted 10/02/2012 for operative treatment of cervical myelopathy. Patient has severe unremitting pain that affects sleep, daily activities, and work/hobbies. After pre-op clearance the patient was taken to the operating room on 10/02/2012 and underwent  Procedure(s): ANTERIOR CERVICAL DECOMPRESSION/DISCECTOMY FUSION 1 LEVEL.    Patient was given perioperative antibiotics: Anti-infectives     Start     Dose/Rate Route Frequency Ordered Stop   10/03/12 0600   ceFAZolin (ANCEF) IVPB 2 g/50 mL premix        2 g 100 mL/hr over 30 Minutes Intravenous 60 min pre-op 10/02/12 1325 10/02/12 1505   10/02/12 2200   ceFAZolin (ANCEF) IVPB 1 g/50 mL premix        1 g 100 mL/hr over 30 Minutes Intravenous Every 8 hours 10/02/12 2012 10/03/12 0609   10/02/12 1249   ceFAZolin (ANCEF) 2-3 GM-% IVPB SOLR     Comments: WILHELM, JENNIFER: cabinet override         10/02/12 1249 10/03/12 0059   10/02/12 1248   ceFAZolin (ANCEF) IVPB 2 g/50 mL premix  Status:  Discontinued        2 g 100 mL/hr over 30 Minutes Intravenous 60  min pre-op 10/02/12 1248 10/02/12 1326           Patient was given sequential compression devices, early ambulation to prevent DVT.  Patient benefited maximally from hospital stay and there were no complications.    Recent vital signs: No data found.    Recent laboratory studies:  Basename 10/02/12 1307  WBC 8.0  HGB 13.8  HCT 39.6  PLT 264  NA 139  K 4.3  CL 104  CO2 25  BUN 18  CREATININE 0.60  GLUCOSE 99  INR 1.04  CALCIUM 9.6     Discharge Medications:     Medication List     As of 10/04/2012  3:03 PM    STOP taking these medications         cyclobenzaprine 10 MG tablet   Commonly known as: FLEXERIL      TAKE these medications         CLARAVIS 40 MG capsule   Generic drug: ISOtretinoin   Take 40 mg by mouth daily.        Diagnostic Studies: Dg Chest 2 View  10/02/2012  *RADIOLOGY REPORT*  Clinical Data: Preop.  Hypertension.  CHEST - 2 VIEW  Comparison: None  Findings: Heart and mediastinal contours are within normal limits.  No focal opacities or effusions.  No acute bony abnormality.  IMPRESSION: No active cardiopulmonary disease.   Original Report Authenticated By: Cyndie Chime, M.D.    Dg Cervical Spine 1 View  10/02/2012  *RADIOLOGY REPORT*  Clinical Data: C5-6 fusion  DG CERVICAL SPINE - 1 VIEW,DG C-ARM 1-60 MIN  Comparison: 09/30/2012, 10/02/2012  Findings: Single lateral spot fluoroscopic intraoperative view demonstrates anterior cervical discectomy with disc spacer at C5-6. Normal alignment.  IMPRESSION: Expected appearance status post C5-6 ACDF.   Original Report Authenticated By: Judie Petit. Ruel Favors, M.D.    X-ray Cervical Spine Ap And Lateral  10/02/2012  *RADIOLOGY REPORT*  Clinical Data: Preoperative exam.  CERVICAL SPINE - 2-3 VIEW  Comparison: 09/30/2012 MR.  Findings: Reversal of the normal cervical lordosis.  Degenerative changes C5-6 with anterior osteophyte.  IMPRESSION: Reversal of the normal cervical lordosis.  Degenerative changes  C5-6 with anterior osteophyte.   Original Report Authenticated By: Fuller Canada, M.D.    Mr Cervical Spine Wo Contrast  09/30/2012  *RADIOLOGY REPORT*  Clinical Data: 35 year old female with neck pain, bilateral upper extremity numbness, vertigo.  MRI CERVICAL SPINE WITHOUT CONTRAST  Technique:  Multiplanar and multiecho pulse sequences of the cervical spine, to include the craniocervical junction and cervicothoracic junction, were obtained according to standard protocol without intravenous contrast.  Comparison: None.  Findings: Straightening of cervical lordosis.  Mild endplate edema at U9-W1 appears to be degenerative. No acute osseous abnormality identified.  Cervicomedullary junction is within normal limits.  Visualized paraspinal soft tissues are within normal limits.  Abnormal spinal cord compression and signal at C5-C6 related to the large disc herniation (see below).  Abnormal signal extends from the mid to upper C5 vertebral body level to the mid to lower C6 vertebral body level.  Cord compression is severe. Elsewhere the visualize spinal cord signal and morphology is within normal limits.  C2-C3:  Negative.  C3-C4:  Mild facet and uncovertebral hypertrophy.  Mild left C4 foraminal stenosis.  C4-C5:  Negative.  C5-C6:  Loss of disc height and circumferential disc bulge with superimposed large central disc extrusion.  Mostly cephalad migration of disc material.  Severe spinal stenosis, severe cord compression, associated cord signal abnormality as above.  Mild left and mild to moderate right C6 foraminal stenosis, appears mostly related to disc.  C6-C7:  Trace caudal migration of disc or less likely thickening of the posterior longitudinal ligament extends from the above level to this disc space level.  Minimal disc bulge otherwise.  No significant spinal stenosis.  Mild facet hypertrophy with no foraminal stenosis.  C7-T1:  Mild to moderate facet hypertrophy.  Otherwise negative.  Negative  visualized upper thoracic levels except for mild facet hypertrophy.  IMPRESSION: 1.  C5-C6 spinal cord compression and abnormal spinal cord signal, presumably edema, related to a large C5-C6 disc herniation as above. Some involvement of the right C6 foramen suspected. 2.  Mild cervical degenerative changes elsewhere.  Findings discussed with Dr. Marshell Levan Nurse [place]ian Co at 1318 hours on 09/30/2012, who arranged for an immediate follow up appointment with Dr. Yevette Edwards.   Original Report Authenticated By: Harley Hallmark, M.D.    Dg C-arm 1-60 Min  10/02/2012  *RADIOLOGY REPORT*  Clinical Data: C5-6 fusion  DG CERVICAL SPINE - 1 VIEW,DG C-ARM 1-60 MIN  Comparison: 09/30/2012, 10/02/2012  Findings: Single lateral spot fluoroscopic intraoperative view demonstrates anterior cervical discectomy with disc spacer at C5-6. Normal alignment.  IMPRESSION: Expected appearance status post C5-6 ACDF.   Original  Report Authenticated By: Judie Petit. Ruel Favors, M.D.     Disposition: 01-Home or Self Care       Signed: Emilee Hero 10/04/2012, 3:03 PM

## 2013-02-19 ENCOUNTER — Telehealth: Payer: Self-pay | Admitting: Obstetrics and Gynecology

## 2013-02-20 ENCOUNTER — Other Ambulatory Visit: Payer: Self-pay | Admitting: Obstetrics and Gynecology

## 2013-02-21 LAB — GC/CHLAMYDIA PROBE AMP: GC Probe RNA: NEGATIVE

## 2013-02-22 LAB — URINE CULTURE: Colony Count: >=100000

## 2013-05-11 ENCOUNTER — Encounter (HOSPITAL_COMMUNITY): Payer: Self-pay | Admitting: Emergency Medicine

## 2013-05-11 ENCOUNTER — Emergency Department (HOSPITAL_COMMUNITY)
Admission: EM | Admit: 2013-05-11 | Discharge: 2013-05-11 | Disposition: A | Discharge status: Home / Self Care | Payer: 59 | Attending: Emergency Medicine | Admitting: Emergency Medicine

## 2013-05-11 ENCOUNTER — Emergency Department (HOSPITAL_COMMUNITY): Payer: 59

## 2013-05-11 DIAGNOSIS — I10 Essential (primary) hypertension: Secondary | ICD-10-CM | POA: Insufficient documentation

## 2013-05-11 DIAGNOSIS — F411 Generalized anxiety disorder: Secondary | ICD-10-CM | POA: Insufficient documentation

## 2013-05-11 DIAGNOSIS — S0101XA Laceration without foreign body of scalp, initial encounter: Secondary | ICD-10-CM

## 2013-05-11 DIAGNOSIS — M542 Cervicalgia: Secondary | ICD-10-CM | POA: Insufficient documentation

## 2013-05-11 DIAGNOSIS — Z87891 Personal history of nicotine dependence: Secondary | ICD-10-CM | POA: Insufficient documentation

## 2013-05-11 DIAGNOSIS — Z8679 Personal history of other diseases of the circulatory system: Secondary | ICD-10-CM | POA: Insufficient documentation

## 2013-05-11 DIAGNOSIS — R000 Tachycardia, unspecified: Secondary | ICD-10-CM | POA: Insufficient documentation

## 2013-05-11 DIAGNOSIS — Z9189 Other specified personal risk factors, not elsewhere classified: Secondary | ICD-10-CM | POA: Insufficient documentation

## 2013-05-11 DIAGNOSIS — Z8742 Personal history of other diseases of the female genital tract: Secondary | ICD-10-CM | POA: Insufficient documentation

## 2013-05-11 DIAGNOSIS — S0100XA Unspecified open wound of scalp, initial encounter: Secondary | ICD-10-CM | POA: Insufficient documentation

## 2013-05-11 DIAGNOSIS — E0500 Thyrotoxicosis with diffuse goiter without thyrotoxic crisis or storm: Secondary | ICD-10-CM | POA: Insufficient documentation

## 2013-05-11 MED ORDER — IBUPROFEN 800 MG PO TABS
800.0000 mg | ORAL_TABLET | Freq: Once | ORAL | Status: DC
  Filled 2013-05-11: qty 1

## 2013-05-11 NOTE — ED Provider Notes (Signed)
Medical screening examination/treatment/procedure(s) were performed by non-physician practitioner and as supervising physician I was immediately available for consultation/collaboration.  Geoffery Lyons, MD 05/11/13 1248

## 2013-05-11 NOTE — ED Notes (Signed)
Patient discharged to home with family. NAD. Patient refused motrin.
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
## 2013-05-12 ENCOUNTER — Telehealth (HOSPITAL_COMMUNITY): Payer: Self-pay | Admitting: Emergency Medicine

## 2013-11-20 ENCOUNTER — Encounter: Payer: Self-pay | Admitting: Emergency Medicine

## 2013-11-20 ENCOUNTER — Emergency Department
Admission: EM | Admit: 2013-11-20 | Discharge: 2013-11-20 | Disposition: A | Discharge status: Home / Self Care | Payer: 59 | Source: Home / Self Care | Attending: Family Medicine | Admitting: Family Medicine

## 2013-11-20 DIAGNOSIS — J029 Acute pharyngitis, unspecified: Secondary | ICD-10-CM

## 2013-11-20 MED ORDER — BENZONATATE 100 MG PO CAPS: 100.0000 mg | ORAL_CAPSULE | Freq: Three times a day (TID) | ORAL | Status: DC | PRN

## 2013-11-20 NOTE — ED Provider Notes (Signed)
CSN: [id number]     Arrival date & time 11/20/13  1044 History   First MD Initiated Contact with Patient 11/20/13 1046     Chief Complaint  Patient presents with  . Sore Throat  . Cough    HPI  URI Symptoms Onset: 2-3 days  Description: rhinorrhea, nasal congestion, cough, chest congestion  Modifying factors:  Hx/o strep in the past.   Symptoms Nasal discharge: yes Fever: no Sore throat: yes Cough: yes Wheezing: no Ear pain: no GI symptoms: no Sick contacts: yes  Red Flags  Stiff neck: no Dyspnea: no Rash: no Swallowing difficulty: no  Sinusitis Risk Factors Headache/face pain: no Double sickening: no tooth pain: no  Allergy Risk Factors Sneezing: no Itchy scratchy throat: no Seasonal symptoms: no  Flu Risk Factors Headache: no muscle aches: no severe fatigue: no   Past Medical History  Diagnosis Date  . Abnormal finding on Pap smear, ASCUS 1997  . Graves disease   . PONV (postoperative nausea and vomiting)   . Hypertension     doesn't require meds  . Headache(784.0)     occaionally  . Dizziness   . Chronic neck pain     bulding disc  . Rash     arms and feet  . Muscle spasms of head and/or neck     takes Flexeril prn  . Anxiety     doesn't require meds  . H/O echocardiogram     pt. consulted /w Dr. Elease Hashimoto, 2 + yrs. ago, had ECHO due to Graves disease.    Past Surgical History  Procedure Laterality Date  . Cervical cone biopsy      twice  . Breast enhancement surgery    . Anterior cervical decomp/discectomy fusion  10/02/2012    Procedure: ANTERIOR CERVICAL DECOMPRESSION/DISCECTOMY FUSION 1 LEVEL;  Surgeon: Emilee Hero, MD;  Location: [facility] OR;  Service: Orthopedics;  Laterality: Bilateral;  Anterior cervical decompression fusion, 5-6 with instrumentation, allograft.   Family History  Problem Relation Age of Onset  . Hypertension Mother    History  Substance Use Topics  . Smoking status: Former Games developer  . Smokeless tobacco:  Never Used     Comment: quit in mar 2013-uses EC cigarette  . Alcohol Use: Yes     Comment: more frequently than occaionally   OB History   Grav Para Term Preterm Abortions TAB SAB Ect Mult Living   3 1 1  0 2     1     Review of Systems  All other systems reviewed and are negative.    Allergies  Review of patient's allergies indicates no known allergies.  Home Medications  No current outpatient prescriptions on file. BP 146/106  Pulse 95  Temp(Src) 98.9 F (37.2 C) (Tympanic)  Resp 16  Ht 5\' 6"  (1.676 m)  Wt 139 lb (63.05 kg)  BMI 22.45 kg/m2  SpO2 99% Physical Exam  Constitutional: She is oriented to person, place, and time. She appears well-developed and well-nourished.  HENT:  Head: Normocephalic and atraumatic.  Right Ear: External ear normal.  Left Ear: External ear normal.  +nasal erythema, rhinorrhea bilaterally, + post oropharyngeal erythema    Eyes: Conjunctivae are normal. Pupils are equal, round, and reactive to light.  Neck: Normal range of motion. Neck supple.  Cardiovascular: Normal rate and regular rhythm.   Pulmonary/Chest: Effort normal and breath sounds normal.  Abdominal: Soft.  Musculoskeletal: Normal range of motion.  Lymphadenopathy:    She has cervical adenopathy.  Neurological:  She is alert and oriented to person, place, and time.  Skin: Skin is warm.    ED Course  Procedures (including critical care time) Labs Review Labs Reviewed  POCT RAPID STREP A (OFFICE)   Imaging Review No results found.  EKG Interpretation    Date/Time:    Ventricular Rate:    PR Interval:    QRS Duration:   QT Interval:    QTC Calculation:   R Axis:     Text Interpretation:              MDM   1. Acute pharyngitis    Likely viral process Rapid strep negative Will culture given hx/o strep in the past.  Discussed supportive care and infectious/resp red flags.  Follow up as needed.     The patient and/or caregiver has been counseled  thoroughly with regard to treatment plan and/or medications prescribed including dosage, schedule, interactions, rationale for use, and possible side effects and they verbalize understanding. Diagnoses and expected course of recovery discussed and will return if not improved as expected or if the condition worsens. Patient and/or caregiver verbalized understanding.         Doree Albee, MD 11/20/13 [phone]

## 2013-11-20 NOTE — ED Notes (Signed)
Sharon Jones c/o sore throat, HA and productive cough and congestion x 2 days. She has had a flu vac this year. Hx of strep.

## 2013-11-21 LAB — STREP A DNA PROBE: GASP: NEGATIVE

## 2013-11-22 ENCOUNTER — Telehealth: Payer: Self-pay

## 2013-11-22 NOTE — ED Notes (Signed)
Unable to reach patient.

## 2014-10-01 ENCOUNTER — Encounter (HOSPITAL_COMMUNITY): Payer: Self-pay | Admitting: Pharmacist

## 2014-10-05 ENCOUNTER — Encounter: Payer: Self-pay | Admitting: Emergency Medicine

## 2014-10-06 ENCOUNTER — Encounter (HOSPITAL_COMMUNITY): Payer: Self-pay | Admitting: *Deleted

## 2014-10-08 ENCOUNTER — Encounter (HOSPITAL_COMMUNITY): Payer: Self-pay | Admitting: Obstetrics and Gynecology

## 2014-10-08 DIAGNOSIS — N906 Unspecified hypertrophy of vulva: Secondary | ICD-10-CM | POA: Diagnosis present

## 2014-10-08 HISTORY — DX: Unspecified hypertrophy of vulva: N90.60

## 2014-10-08 NOTE — H&P (Signed)
Sharon Jones is an 38 y.o. female. G1P1 for labia plasty due to hypertrophic vulva - symptomatic - discomfort with tight clothing and dyspareunia  Pertinent Gynecological History: Menses: irregular with IUD Contraception: IUD Sexually transmitted diseases: no past historyrisk:32110} Last pap: normal, abn in past, CKC x 2 OB History: G1P1001   Menstrual History:  No LMP recorded. Patient is not currently having periods (Reason: IUD).    Past Medical History  Diagnosis Date  . Abnormal finding on Pap smear, ASCUS 1997  . Graves disease   . PONV (postoperative nausea and vomiting)   . Hypertension     doesn't require meds  . Dizziness   . Chronic neck pain     bulding disc  . Rash     arms and feet  . Anxiety     doesn't require meds  . H/O echocardiogram     pt. consulted /w Dr. Acie Fredrickson, 2 + yrs. ago, had ECHO due to Graves disease.   . SVD (spontaneous vaginal delivery)     x 1  . Headache     otc med prn  . Hypertrophy, vulva 10/08/2014    Past Surgical History  Procedure Laterality Date  . Cervical cone biopsy      twice  . Breast enhancement surgery    . Anterior cervical decomp/discectomy fusion  10/02/2012    Procedure: ANTERIOR CERVICAL DECOMPRESSION/DISCECTOMY FUSION 1 LEVEL;  Surgeon: Sinclair Ship, MD;  Location: [facility];  Service: Orthopedics;  Laterality: Bilateral;  Anterior cervical decompression fusion, 5-6 with instrumentation, allograft.    Family History  Problem Relation Age of Onset  . Hypertension Mother     Social History:  reports that she quit smoking about 3 years ago. Her smoking use included Cigarettes. She smoked 0.00 packs per day. She has never used smokeless tobacco. Uses Ecigs.  She reports that she drinks alcohol. She reports that she does not use illicit drugs.  Nurse tech, single   Allergies: No Known Allergies  Meds: Ativan, Flexeril, Epiduo, Mirena    Review of Systems  Constitutional: Negative.   HENT: Negative.    Eyes: Negative.   Respiratory: Negative.   Cardiovascular: Negative.   Gastrointestinal: Negative.   Genitourinary: Negative.   Musculoskeletal: Negative.   Skin: Negative.   Neurological: Negative.   Psychiatric/Behavioral: Negative.     Height 5\' 6"  (1.676 m), weight 65.772 kg (145 lb). Physical Exam  Constitutional: She is oriented to person, place, and time. She appears well-developed and well-nourished.  HENT:  Head: Normocephalic and atraumatic.  Cardiovascular: Normal rate and regular rhythm.   Respiratory: Effort normal and breath sounds normal. No respiratory distress. She has no wheezes.  GI: Soft. Bowel sounds are normal. She exhibits no distension. There is no tenderness.  Genitourinary:  Hypertrophic labia  Musculoskeletal: Normal range of motion.  Neurological: She is alert and oriented to person, place, and time.  Skin: Skin is warm and dry.  Psychiatric: She has a normal mood and affect. Her behavior is normal.    No results found for this or any previous visit (from the past 24 hour(s)).  No results found.  Assessment/Plan: 38yo G1P1001 with hypertrophic labia for labiaplasty D/w pt r/b/a of surgical treatment Pt voices understanding, wishes to proceed  Bovard-Stuckert, Addasyn Mcbreen 10/08/2014, 10:21 PM

## 2014-10-09 ENCOUNTER — Ambulatory Visit (HOSPITAL_COMMUNITY)
Admission: RE | Admit: 2014-10-09 | Discharge: 2014-10-09 | Disposition: A | Discharge status: Home / Self Care | Payer: 59 | Source: Ambulatory Visit | Attending: Obstetrics and Gynecology | Admitting: Obstetrics and Gynecology

## 2014-10-09 ENCOUNTER — Encounter (HOSPITAL_COMMUNITY)
Admission: RE | Disposition: A | Discharge status: Home / Self Care | Payer: Self-pay | Source: Ambulatory Visit | Attending: Obstetrics and Gynecology

## 2014-10-09 ENCOUNTER — Ambulatory Visit (HOSPITAL_COMMUNITY): Payer: 59 | Admitting: Anesthesiology

## 2014-10-09 ENCOUNTER — Encounter (HOSPITAL_COMMUNITY): Payer: Self-pay | Admitting: Anesthesiology

## 2014-10-09 DIAGNOSIS — N941 Dyspareunia: Secondary | ICD-10-CM | POA: Insufficient documentation

## 2014-10-09 DIAGNOSIS — M542 Cervicalgia: Secondary | ICD-10-CM | POA: Insufficient documentation

## 2014-10-09 DIAGNOSIS — I10 Essential (primary) hypertension: Secondary | ICD-10-CM | POA: Diagnosis not present

## 2014-10-09 DIAGNOSIS — E0500 Thyrotoxicosis with diffuse goiter without thyrotoxic crisis or storm: Secondary | ICD-10-CM | POA: Insufficient documentation

## 2014-10-09 DIAGNOSIS — G8929 Other chronic pain: Secondary | ICD-10-CM | POA: Diagnosis not present

## 2014-10-09 DIAGNOSIS — N906 Unspecified hypertrophy of vulva: Secondary | ICD-10-CM | POA: Diagnosis present

## 2014-10-09 DIAGNOSIS — Z79899 Other long term (current) drug therapy: Secondary | ICD-10-CM | POA: Diagnosis not present

## 2014-10-09 DIAGNOSIS — F419 Anxiety disorder, unspecified: Secondary | ICD-10-CM | POA: Diagnosis not present

## 2014-10-09 DIAGNOSIS — Z87891 Personal history of nicotine dependence: Secondary | ICD-10-CM | POA: Diagnosis not present

## 2014-10-09 HISTORY — DX: Headache, unspecified: R51.9

## 2014-10-09 HISTORY — DX: Unspecified hypertrophy of vulva: N90.60

## 2014-10-09 HISTORY — DX: Headache: R51

## 2014-10-09 HISTORY — PX: LABIOPLASTY: SHX1900

## 2014-10-09 LAB — CBC
HEMATOCRIT: 37.9 % (ref 36.0–46.0)
Hemoglobin: 13.2 g/dL (ref 12.0–15.0)
MCH: 32.2 pg (ref 26.0–34.0)
MCHC: 34.8 g/dL (ref 30.0–36.0)
MCV: 92.4 fL (ref 78.0–100.0)
Platelets: 241 10*3/uL (ref 150–400)
RBC: 4.1 MIL/uL (ref 3.87–5.11)
RDW: 13.1 % (ref 11.5–15.5)
WBC: 8 10*3/uL (ref 4.0–10.5)

## 2014-10-09 LAB — PREGNANCY, URINE: Preg Test, Ur: NEGATIVE

## 2014-10-09 SURGERY — LABIAPLASTY, VULVA
Anesthesia: General | Site: Vagina

## 2014-10-09 MED ORDER — MIDAZOLAM HCL 2 MG/2ML IJ SOLN
INTRAMUSCULAR | Status: AC
  Filled 2014-10-09: qty 2

## 2014-10-09 MED ORDER — PROMETHAZINE HCL 25 MG/ML IJ SOLN: 6.2500 mg | INTRAMUSCULAR | Status: DC | PRN

## 2014-10-09 MED ORDER — MIDAZOLAM HCL 2 MG/2ML IJ SOLN
INTRAMUSCULAR | Status: DC | PRN
  Administered 2014-10-09: 2 mg via INTRAVENOUS

## 2014-10-09 MED ORDER — BUPIVACAINE-EPINEPHRINE (PF) 0.25% -1:200000 IJ SOLN
INTRAMUSCULAR | Status: AC
  Filled 2014-10-09: qty 30

## 2014-10-09 MED ORDER — IBUPROFEN 800 MG PO TABS: 800.0000 mg | ORAL_TABLET | Freq: Three times a day (TID) | ORAL | Status: DC | PRN

## 2014-10-09 MED ORDER — MEPERIDINE HCL 25 MG/ML IJ SOLN
6.2500 mg | INTRAMUSCULAR | Status: DC | PRN
Start: 2014-10-09 — End: 2014-10-09

## 2014-10-09 MED ORDER — OXYCODONE-ACETAMINOPHEN 5-325 MG PO TABS: 1.0000 | ORAL_TABLET | Freq: Four times a day (QID) | ORAL | Status: DC | PRN

## 2014-10-09 MED ORDER — BUPIVACAINE-EPINEPHRINE 0.25% -1:200000 IJ SOLN
INTRAMUSCULAR | Status: DC | PRN
  Administered 2014-10-09: 30 mL

## 2014-10-09 MED ORDER — LIDOCAINE-EPINEPHRINE (PF) 1 %-1:200000 IJ SOLN
INTRAMUSCULAR | Status: AC
  Filled 2014-10-09: qty 10

## 2014-10-09 MED ORDER — DEXAMETHASONE SODIUM PHOSPHATE 10 MG/ML IJ SOLN
INTRAMUSCULAR | Status: AC
  Filled 2014-10-09: qty 1

## 2014-10-09 MED ORDER — LACTATED RINGERS IV SOLN
INTRAVENOUS | Status: DC
  Administered 2014-10-09 (×2): via INTRAVENOUS

## 2014-10-09 MED ORDER — LIDOCAINE HCL (CARDIAC) 20 MG/ML IV SOLN
INTRAVENOUS | Status: DC | PRN
  Administered 2014-10-09: 80 mg via INTRAVENOUS

## 2014-10-09 MED ORDER — FENTANYL CITRATE 0.05 MG/ML IJ SOLN
INTRAMUSCULAR | Status: AC
  Filled 2014-10-09: qty 5

## 2014-10-09 MED ORDER — SILVER SULFADIAZINE 1 % EX CREA
TOPICAL_CREAM | CUTANEOUS | Status: DC | PRN
  Administered 2014-10-09: 1 via TOPICAL

## 2014-10-09 MED ORDER — HYDROMORPHONE HCL 1 MG/ML IJ SOLN
INTRAMUSCULAR | Status: DC | PRN
  Administered 2014-10-09 (×2): 0.5 mg via INTRAVENOUS

## 2014-10-09 MED ORDER — ONDANSETRON HCL 4 MG/2ML IJ SOLN
INTRAMUSCULAR | Status: DC | PRN
  Administered 2014-10-09: 4 mg via INTRAVENOUS

## 2014-10-09 MED ORDER — KETOROLAC TROMETHAMINE 30 MG/ML IJ SOLN
INTRAMUSCULAR | Status: DC | PRN
  Administered 2014-10-09: 30 mg via INTRAVENOUS

## 2014-10-09 MED ORDER — PROPOFOL 10 MG/ML IV BOLUS
INTRAVENOUS | Status: DC | PRN
  Administered 2014-10-09: 200 mg via INTRAVENOUS

## 2014-10-09 MED ORDER — FENTANYL CITRATE 0.05 MG/ML IJ SOLN
25.0000 ug | INTRAMUSCULAR | Status: DC | PRN
  Administered 2014-10-09 (×2): 50 ug via INTRAVENOUS

## 2014-10-09 MED ORDER — SCOPOLAMINE 1 MG/3DAYS TD PT72
1.0000 | MEDICATED_PATCH | Freq: Once | TRANSDERMAL | Status: DC
  Administered 2014-10-09: 1.5 mg via TRANSDERMAL

## 2014-10-09 MED ORDER — CEFAZOLIN SODIUM-DEXTROSE 2-3 GM-% IV SOLR
2.0000 g | INTRAVENOUS | Status: AC
  Administered 2014-10-09: 2 g via INTRAVENOUS

## 2014-10-09 MED ORDER — LACTATED RINGERS IV SOLN: INTRAVENOUS | Status: DC

## 2014-10-09 MED ORDER — DEXAMETHASONE SODIUM PHOSPHATE 4 MG/ML IJ SOLN
INTRAMUSCULAR | Status: DC | PRN
  Administered 2014-10-09: 4 mg via INTRAVENOUS

## 2014-10-09 MED ORDER — HYDROMORPHONE HCL 1 MG/ML IJ SOLN
INTRAMUSCULAR | Status: AC
  Filled 2014-10-09: qty 1

## 2014-10-09 MED ORDER — SILVER SULFADIAZINE 1 % EX CREA
TOPICAL_CREAM | CUTANEOUS | Status: AC
  Filled 2014-10-09: qty 50

## 2014-10-09 MED ORDER — CEFAZOLIN SODIUM-DEXTROSE 2-3 GM-% IV SOLR
INTRAVENOUS | Status: AC
  Filled 2014-10-09: qty 50

## 2014-10-09 MED ORDER — SCOPOLAMINE 1 MG/3DAYS TD PT72
MEDICATED_PATCH | TRANSDERMAL | Status: AC
  Administered 2014-10-09: 1.5 mg via TRANSDERMAL
  Filled 2014-10-09: qty 1

## 2014-10-09 MED ORDER — GLYCOPYRROLATE 0.2 MG/ML IJ SOLN
INTRAMUSCULAR | Status: DC | PRN
  Administered 2014-10-09: 0.1 mg via INTRAVENOUS

## 2014-10-09 MED ORDER — PROPOFOL 10 MG/ML IV EMUL
INTRAVENOUS | Status: AC
  Filled 2014-10-09: qty 20

## 2014-10-09 MED ORDER — FENTANYL CITRATE 0.05 MG/ML IJ SOLN
INTRAMUSCULAR | Status: DC | PRN
  Administered 2014-10-09: 50 ug via INTRAVENOUS
  Administered 2014-10-09: 100 ug via INTRAVENOUS
  Administered 2014-10-09 (×4): 25 ug via INTRAVENOUS

## 2014-10-09 MED ORDER — FENTANYL CITRATE 0.05 MG/ML IJ SOLN
INTRAMUSCULAR | Status: AC
  Administered 2014-10-09: 50 ug via INTRAVENOUS
  Filled 2014-10-09: qty 2

## 2014-10-09 MED ORDER — LIDOCAINE HCL (CARDIAC) 20 MG/ML IV SOLN
INTRAVENOUS | Status: AC
  Filled 2014-10-09: qty 5

## 2014-10-09 MED ORDER — KETOROLAC TROMETHAMINE 30 MG/ML IJ SOLN: 15.0000 mg | Freq: Once | INTRAMUSCULAR | Status: DC | PRN

## 2014-10-09 MED ORDER — ONDANSETRON HCL 4 MG/2ML IJ SOLN
INTRAMUSCULAR | Status: AC
  Filled 2014-10-09: qty 2

## 2014-10-09 SURGICAL SUPPLY — 18 items
BLADE 15 SAFETY STRL DISP (BLADE) ×3 IMPLANT
CATH ROBINSON RED A/P 16FR (CATHETERS) ×3 IMPLANT
CLOTH BEACON ORANGE TIMEOUT ST (SAFETY) ×3 IMPLANT
COUNTER NEEDLE 1200 MAGNETIC (NEEDLE) IMPLANT
ELECT REM PT RETURN 9FT ADLT (ELECTROSURGICAL) ×3
ELECTRODE REM PT RTRN 9FT ADLT (ELECTROSURGICAL) ×1 IMPLANT
GLOVE BIO SURGEON STRL SZ 6.5 (GLOVE) ×2 IMPLANT
GLOVE BIO SURGEONS STRL SZ 6.5 (GLOVE) ×1
GOWN STRL REUS W/TWL LRG LVL3 (GOWN DISPOSABLE) ×6 IMPLANT
NEEDLE HYPO 22GX1.5 SAFETY (NEEDLE) ×3 IMPLANT
PACK VAGINAL MINOR WOMEN LF (CUSTOM PROCEDURE TRAY) ×3 IMPLANT
PAD OB MATERNITY 4.3X12.25 (PERSONAL CARE ITEMS) ×3 IMPLANT
PAD PREP 24X48 CUFFED NSTRL (MISCELLANEOUS) ×3 IMPLANT
PENCIL BUTTON HOLSTER BLD 10FT (ELECTRODE) IMPLANT
SUT VIC AB 3-0 SH 27 (SUTURE) ×18
SUT VIC AB 3-0 SH 27X BRD (SUTURE) IMPLANT
TOWEL OR 17X24 6PK STRL BLUE (TOWEL DISPOSABLE) ×6 IMPLANT
WATER STERILE IRR 1000ML POUR (IV SOLUTION) ×3 IMPLANT

## 2014-10-09 NOTE — Brief Op Note (Signed)
10/09/2014  11:23 AM  PATIENT:  Sharon Jones  38 y.o. female  PRE-OPERATIVE DIAGNOSIS:  Hypertrophy of Labia, 566[address]  POST-OPERATIVE DIAGNOSIS:  hypertrophy of labia  PROCEDURE:  Procedure(s): LABIAPLASTY (N/A)  SURGEON:  Surgeon(s) and Role:    * Janyth Contes, MD - Primary  ASSISTANTS: Windy Carina, FNPS   FINDINGS: B labial hypertrophy  ANESTHESIA:   general by LMA  EBL:  Total I/O In: 1000 [I.V.:1000] Out: 75 [Urine:25; Blood:50]  BLOOD ADMINISTERED:none  DRAINS: none   LOCAL MEDICATIONS USED:  MARCAINE 0.25% with Epi and Amount: 30 ml  SPECIMEN:  No Specimen  DISPOSITION OF SPECIMEN:  N/A  COUNTS:  YES  TOURNIQUET:  * No tourniquets in log *  DICTATION: .Other Dictation: Dictation Number [id number]  PLAN OF CARE: Discharge to home after PACU  PATIENT DISPOSITION:  PACU - hemodynamically stable.   Delay start of Pharmacological VTE agent (>24hrs) due to surgical blood loss or risk of bleeding: not applicable

## 2014-10-09 NOTE — Discharge Instructions (Signed)
DISCHARGE INSTRUCTIONS: LABIAPLASTY  The following instructions have been prepared to help you care for yourself upon your return home.  MAY TAKE IBUPROFEN (MOTRIN, ADVIL) OR ALEVE AFTER 5:00 PM FOR PAIN!!!   Personal hygiene:  Use sanitary pads for vaginal drainage, not tampons.  Shower the day after your procedure.  NO tub baths, pools or Jacuzzis for 2-3 weeks.  Wipe front to back after using the bathroom.  Activity and limitations:  Do NOT drive or operate any equipment for 24 hours. The effects of anesthesia are still present and drowsiness may result.  Do NOT rest in bed all day.  Walking is encouraged.  Walk up and down stairs slowly.  You may resume your normal activity in one to two days or as indicated by your physician.  Sexual activity: NO intercourse for at least 2 weeks after the procedure, or as indicated by your physician.  Diet: Eat a light meal as desired this evening. You may resume your usual diet tomorrow.  Return to work: You may resume your work activities in one to two days or as indicated by your doctor.  What to expect after your surgery: Expect to have vaginal bleeding/discharge for 2-3 days and spotting for up to 10 days. It is not unusual to have soreness for up to 1-2 weeks. You may have a slight burning sensation when you urinate for the first day. Mild cramps may continue for a couple of days. You may have a regular period in 2-6 weeks.  Call your doctor for any of the following:  Excessive vaginal bleeding, saturating and changing one pad every hour.  Inability to urinate 6 hours after discharge from hospital.  Pain not relieved by pain medication.  Fever of 100.4 F or greater.  Unusual vaginal discharge or odor.   Call for an appointment:    Patients signature: ______________________  Nurses signature ________________________  Support person's signature_______________________

## 2014-10-09 NOTE — Anesthesia Postprocedure Evaluation (Signed)
  Anesthesia Post-op Note  Anesthesia Post Note  Patient: Sharon Jones  Procedure(s) Performed: Procedure(s) (LRB): LABIAPLASTY (N/A)  Anesthesia type: General  Patient location: PACU  Post pain: Pain level controlled  Post assessment: Post-op Vital signs reviewed  Last Vitals:  Filed Vitals:   10/09/14 1130  BP: 139/83  Pulse: 80  Temp:   Resp: 12    Post vital signs: Reviewed  Level of consciousness: sedated  Complications: No apparent anesthesia complications

## 2014-10-09 NOTE — Anesthesia Preprocedure Evaluation (Addendum)
Anesthesia Evaluation  Patient identified by MRN, date of birth, ID band Patient awake    Reviewed: Allergy & Precautions, H&P , NPO status , Patient's Chart, lab work & pertinent test results, reviewed documented beta blocker date and time   History of Anesthesia Complications (+) PONV  Airway Mallampati: II  TM Distance: >3 FB Neck ROM: full    Dental  (+) Teeth Intact   Pulmonary former smoker,  breath sounds clear to auscultation  Pulmonary exam normal       Cardiovascular hypertension (no meds), Rhythm:regular Rate:Normal  Normal echo 2012, normal EKG 2013   Neuro/Psych negative psych ROS   GI/Hepatic negative GI ROS, Neg liver ROS,   Endo/Other  Hyperthyroidism (s/p radioactive iodine five years ago, no problems since)   Renal/GU negative Renal ROS  Female GU complaint     Musculoskeletal   Abdominal   Peds  Hematology negative hematology ROS (+)   Anesthesia Other Findings   Reproductive/Obstetrics negative OB ROS                           Anesthesia Physical Anesthesia Plan  ASA: II  Anesthesia Plan: General LMA   Post-op Pain Management:    Induction:   Airway Management Planned:   Additional Equipment:   Intra-op Plan:   Post-operative Plan:   Informed Consent: I have reviewed the patients History and Physical, chart, labs and discussed the procedure including the risks, benefits and alternatives for the proposed anesthesia with the patient or authorized representative who has indicated his/her understanding and acceptance.   Dental Advisory Given  Plan Discussed with: CRNA and Surgeon  Anesthesia Plan Comments:         Anesthesia Quick Evaluation

## 2014-10-09 NOTE — Anesthesia Procedure Notes (Addendum)
Procedure Name: LMA Insertion Date/Time: 10/09/2014 10:17 AM Performed by: Flossie Dibble Pre-anesthesia Checklist: Patient identified, Timeout performed, Emergency Drugs available, Suction available and Patient being monitored Patient Re-evaluated:Patient Re-evaluated prior to inductionOxygen Delivery Method: Circle system utilized Preoxygenation: Pre-oxygenation with 100% oxygen Intubation Type: IV induction Ventilation: Mask ventilation without difficulty LMA: LMA inserted LMA Size: 4.0 Number of attempts: 1 Placement Confirmation: breath sounds checked- equal and bilateral and positive ETCO2 Tube secured with: Tape Dental Injury: Teeth and Oropharynx as per pre-operative assessment

## 2014-10-09 NOTE — Interval H&P Note (Signed)
History and Physical Interval Note:  10/09/2014 9:46 AM  Sharon Jones  has presented today for surgery, with the diagnosis of Hypertrophy of Labia, [phone]  The various methods of treatment have been discussed with the patient and family. After consideration of risks, benefits and other options for treatment, the patient has consented to  Procedure(s): LABIAPLASTY (N/A) as a surgical intervention .  The patient's history has been reviewed, patient examined, no change in status, stable for surgery.  I have reviewed the patient's chart and labs.  Questions were answered to the patient's satisfaction.     Bovard-Stuckert, Caedyn Raygoza

## 2014-10-09 NOTE — Transfer of Care (Signed)
Immediate Anesthesia Transfer of Care Note  Patient: Sharon Jones  Procedure(s) Performed: Procedure(s): LABIAPLASTY (N/A)  Patient Location: PACU  Anesthesia Type:General  Level of Consciousness: awake, alert  and oriented  Airway & Oxygen Therapy: Patient Spontanous Breathing and Patient connected to nasal cannula oxygen  Post-op Assessment: Report given to PACU RN and Post -op Vital signs reviewed and stable  Post vital signs: Reviewed and stable  Complications: No apparent anesthesia complications

## 2014-10-10 NOTE — Op Note (Signed)
NAMEDYSTANY, DUFFY NO.:  [id number]  MEDICAL RECORD NO.:  [id number]  LOCATION:  [facility]                          FACILITY:  [facility]  PHYSICIAN:  Thornell Sartorius, MD        DATE OF BIRTH:  06-26-1976  DATE OF PROCEDURE:  10/09/2014 DATE OF DISCHARGE:  10/09/2014                              OPERATIVE REPORT   PREOPERATIVE DIAGNOSIS:  Hypertrophic labia.  POSTOPERATIVE DIAGNOSIS:  Hypertrophic labia.  PROCEDURE:  Bilateral labioplasty.  SURGEON:  Thornell Sartorius, MD.  ASSISTANT:  Polo Riley, Family Nurse Practitioner Student.  FINDINGS:  Bilateral labial hypertrophy.  ANESTHESIA:  General by LMA.  IV FLUIDS:  1000 mL.  URINE OUTPUT:  25 mL by I and O cath prior to procedure.  EBL:  Approximately 50 mL.  COMPLICATIONS:  None.  PATHOLOGY:  None.  LOCAL ANESTHESIA:  0.25% Marcaine with epinephrine, approximately 30 mL.  PROCEDURE:  After informed consent was reviewed with the patient, she was transported to the OR, placed on table in supine position.  General anesthesia was induced, found to be adequate.  She was then placed in the [place] stirrups, prepped and draped in the normal sterile fashion.  Her bladder was sterilely drained.  The labia were outlined with marking pen and excised with the knife and then reapproximated bilaterally to look symmetric, reapproximated using multiple interrupted sutures of 3-0 Vicryl, found to be hemostatic.  Following the procedure, approximately 30 mL of 0.25% Marcaine with epinephrine was injected for comfort.  Sponge, lap, and needle count was correct x2 per the operating staff.  The patient tolerated the procedure well.     Thornell Sartorius, MD     JB/MEDQ  D:  10/09/2014  T:  10/10/2014  Job:  [id number]

## 2014-10-12 ENCOUNTER — Encounter (HOSPITAL_COMMUNITY): Payer: Self-pay | Admitting: Obstetrics and Gynecology

## 2016-03-11 ENCOUNTER — Inpatient Hospital Stay (HOSPITAL_COMMUNITY): Payer: 59

## 2016-05-03 DIAGNOSIS — Z1389 Encounter for screening for other disorder: Secondary | ICD-10-CM | POA: Diagnosis not present

## 2016-05-03 DIAGNOSIS — Z130 Encounter for screening for diseases of the blood and blood-forming organs and certain disorders involving the immune mechanism: Secondary | ICD-10-CM | POA: Diagnosis not present

## 2016-05-03 DIAGNOSIS — Z1151 Encounter for screening for human papillomavirus (HPV): Secondary | ICD-10-CM | POA: Diagnosis not present

## 2016-05-03 DIAGNOSIS — Z30431 Encounter for routine checking of intrauterine contraceptive device: Secondary | ICD-10-CM | POA: Diagnosis not present

## 2016-05-03 DIAGNOSIS — Z6824 Body mass index (BMI) 24.0-24.9, adult: Secondary | ICD-10-CM | POA: Diagnosis not present

## 2016-05-03 DIAGNOSIS — Z202 Contact with and (suspected) exposure to infections with a predominantly sexual mode of transmission: Secondary | ICD-10-CM | POA: Diagnosis not present

## 2016-05-03 DIAGNOSIS — Z113 Encounter for screening for infections with a predominantly sexual mode of transmission: Secondary | ICD-10-CM | POA: Diagnosis not present

## 2016-05-03 DIAGNOSIS — Z124 Encounter for screening for malignant neoplasm of cervix: Secondary | ICD-10-CM | POA: Diagnosis not present

## 2016-05-03 DIAGNOSIS — Z01419 Encounter for gynecological examination (general) (routine) without abnormal findings: Secondary | ICD-10-CM | POA: Diagnosis not present

## 2016-05-12 DIAGNOSIS — N83202 Unspecified ovarian cyst, left side: Secondary | ICD-10-CM | POA: Diagnosis not present

## 2017-02-27 DIAGNOSIS — S93492A Sprain of other ligament of left ankle, initial encounter: Secondary | ICD-10-CM | POA: Diagnosis not present

## 2017-03-19 ENCOUNTER — Other Ambulatory Visit: Payer: 59 | Admitting: *Deleted

## 2017-03-19 ENCOUNTER — Encounter: Payer: Self-pay | Admitting: Primary Care

## 2017-03-19 ENCOUNTER — Ambulatory Visit (INDEPENDENT_AMBULATORY_CARE_PROVIDER_SITE_OTHER): Payer: 59 | Admitting: Primary Care

## 2017-03-19 VITALS — BP 146/94 | HR 70 | Temp 98.0°F | Ht 65.25 in | Wt 160.1 lb

## 2017-03-19 DIAGNOSIS — I10 Essential (primary) hypertension: Secondary | ICD-10-CM

## 2017-03-19 DIAGNOSIS — Z8639 Personal history of other endocrine, nutritional and metabolic disease: Secondary | ICD-10-CM

## 2017-03-19 DIAGNOSIS — R350 Frequency of micturition: Secondary | ICD-10-CM | POA: Diagnosis not present

## 2017-03-19 DIAGNOSIS — E039 Hypothyroidism, unspecified: Secondary | ICD-10-CM | POA: Insufficient documentation

## 2017-03-19 MED ORDER — HYDROCHLOROTHIAZIDE 25 MG PO TABS: 25.0000 mg | ORAL_TABLET | Freq: Every day | ORAL | 0 refills | Status: DC

## 2017-03-19 MED ORDER — SULFAMETHOXAZOLE-TRIMETHOPRIM 800-160 MG PO TABS: 1.0000 | ORAL_TABLET | Freq: Two times a day (BID) | ORAL | 0 refills | Status: DC

## 2017-03-19 MED ORDER — PHENAZOPYRIDINE HCL 200 MG PO TABS: 200.0000 mg | ORAL_TABLET | Freq: Three times a day (TID) | ORAL | 0 refills | Status: DC | PRN

## 2017-03-19 NOTE — Progress Notes (Signed)
Subjective:    Patient ID: Sharon Jones, female    DOB: 10-04-1976, 41 y.o.   MRN: [id number]  HPI  [name] is a 41 year old female who presents today to establish care and discuss the problems mentioned below. Will obtain old records.  1) Graves Disease: Diagnosed around 2007. She underwent radioactive treatment five years ago. Her last thyroid testing was about 5 years ago. She has noticed symptoms of hot flashes, fatigue, palpitations that occur intermittently 2-3 times over the past 1 month. She's also noticed swelling to her anterior neck over the past 1 year.  2) Elevated BP reading: BP of 146/94 in the clinic today. She has a prior history of elevated blood pressure readings but has never taken prescription medication even though it was recommended. Last week she experienced lightheadedness, nausea, weakness, she checked her BP which was 155/111. She does experience lower extremity edema. She has a strong family history of hypertension. She has taken clonidine from someone elses prescription.   She endorses an unhealthy diet and does not exercise.   Review of Systems  Constitutional: Positive for fatigue.  HENT: Negative for trouble swallowing.   Eyes: Negative for visual disturbance.  Respiratory: Negative for shortness of breath.   Cardiovascular: Positive for leg swelling. Negative for chest pain.  Neurological: Negative for headaches.       Past Medical History:  Diagnosis Date  . Abnormal finding on Pap smear, ASCUS 1997  . Anxiety    doesn't require meds  . Chronic neck pain    bulding disc  . Dizziness   . Graves disease   . H/O echocardiogram    pt. consulted /w Dr. Acie Fredrickson, 2 + yrs. ago, had ECHO due to Graves disease.   Marland Kitchen Headache    otc med prn  . Hypertension    doesn't require meds  . Hypertrophy, vulva 10/08/2014  . PONV (postoperative nausea and vomiting)   . Rash    arms and feet  . SVD (spontaneous vaginal delivery)    x 1     Social History    Social History  . Marital status: Single    Spouse name: N/A  . Number of children: N/A  . Years of education: N/A   Occupational History  . Not on file.   Social History Main Topics  . Smoking status: Former Smoker    Types: Cigarettes    Quit date: 12/04/2010  . Smokeless tobacco: Never Used     Comment: quit in mar 2013-uses EC cigarette  . Alcohol use Yes     Comment: social  . Drug use: No  . Sexual activity: Yes    Birth control/ protection: IUD   Other Topics Concern  . Not on file   Social History Narrative   Married.   1 child.   Works at [employer].   Enjoys spending time with her family.    Past Surgical History:  Procedure Laterality Date  . ANTERIOR CERVICAL DECOMP/DISCECTOMY FUSION  10/02/2012   Procedure: ANTERIOR CERVICAL DECOMPRESSION/DISCECTOMY FUSION 1 LEVEL;  Surgeon: Sinclair Ship, MD;  Location: [facility];  Service: Orthopedics;  Laterality: Bilateral;  Anterior cervical decompression fusion, 5-6 with instrumentation, allograft.  Marland Kitchen BREAST ENHANCEMENT SURGERY    . CERVICAL CONE BIOPSY     twice  . LABIOPLASTY N/A 10/09/2014   Procedure: LABIAPLASTY;  Surgeon: Janyth Contes, MD;  Location: [facility];  Service: Gynecology;  Laterality: N/A;    Family History  Problem Relation  Age of Onset  . Hypertension Mother   . Alcohol abuse Father   . Alcohol abuse Maternal Uncle   . Alcohol abuse Paternal Aunt   . Arthritis Maternal Grandmother   . Alcohol abuse Maternal Grandfather   . Arthritis Maternal Grandfather   . Arthritis Paternal Grandmother   . Arthritis Paternal Grandfather     No Known Allergies  No current outpatient prescriptions on file prior to visit.   No current facility-administered medications on file prior to visit.     BP (!) 146/94   Pulse 70   Temp 98 F (36.7 C) (Oral)   Ht 5' 5.25" (1.657 m)   Wt 160 lb 1.9 oz (72.6 kg)   LMP 02/24/2017   SpO2 98%   BMI 26.44 kg/m    Objective:   Physical Exam    Constitutional: She appears well-nourished.  Neck: Neck supple.  Cardiovascular: Normal rate and regular rhythm.   Mild, non pitting lower extremity edema bilaterally  Pulmonary/Chest: Effort normal and breath sounds normal.  Skin: Skin is warm and dry.  Psychiatric: She has a normal mood and affect.          Assessment & Plan:

## 2017-03-19 NOTE — Assessment & Plan Note (Signed)
Intermittent symptoms.  No thyroid megaly on exam. Check thyroid labs today.

## 2017-03-19 NOTE — Progress Notes (Signed)
Pt here today c/o urinary odor and frequency that started this morning.  UA + for leukocytes and trace blood, urine cx sent to lab.  Bactrim and Pyridium sent to pharmacy, pt instructed on medication use.  Encouraged pt to increase water intake and empty bladder often.

## 2017-03-19 NOTE — Progress Notes (Signed)
Pre visit review using our clinic review tool, if applicable. No additional management support is needed unless otherwise documented below in the visit note. 

## 2017-03-19 NOTE — Assessment & Plan Note (Signed)
Long history of elevated readings, also above goal today. Given recent symptoms, presence of lower extremity edema, strong FH, will treat. Rx for hydrochlorothiazide 25 mg sent to pharmacy. She will monitor her BP and follow up in 3 weeks. BMP during next visit.

## 2017-03-19 NOTE — Patient Instructions (Signed)
Start hydrochlorothiazide 25 mg tablets for high blood pressure. Take 1 tablet by mouth once daily.  Schedule a lab only appointment at your convenience to return for fasting labs.  Schedule a follow up visit in 3 weeks for blood pressure recheck. Continue to monitor your blood pressure in the interim and bring your blood pressure readings to your next visit.  It was a pleasure to meet you today! Please don't hesitate to call me with any questions. Welcome to [facility]!

## 2017-03-20 ENCOUNTER — Other Ambulatory Visit: Payer: 59

## 2017-03-20 DIAGNOSIS — I10 Essential (primary) hypertension: Secondary | ICD-10-CM | POA: Diagnosis not present

## 2017-03-20 DIAGNOSIS — Z8639 Personal history of other endocrine, nutritional and metabolic disease: Secondary | ICD-10-CM | POA: Diagnosis not present

## 2017-03-20 NOTE — Addendum Note (Signed)
Addended by: Ricka Burdock on: 03/20/2017 08:20 AM   Modules accepted: Orders

## 2017-03-21 ENCOUNTER — Other Ambulatory Visit: Payer: Self-pay | Admitting: Primary Care

## 2017-03-21 DIAGNOSIS — E039 Hypothyroidism, unspecified: Secondary | ICD-10-CM

## 2017-03-21 LAB — LIPID PANEL
Chol/HDL Ratio: 2.6 ratio (ref 0.0–4.4)
Cholesterol, Total: 181 mg/dL (ref 100–199)
HDL: 69 mg/dL (ref >39)
LDL Calculated: 91 mg/dL (ref 0–99)
TRIGLYCERIDES: 105 mg/dL (ref 0–149)
VLDL Cholesterol Cal: 21 mg/dL (ref 5–40)

## 2017-03-21 LAB — COMPREHENSIVE METABOLIC PANEL
A/G RATIO: 1.6 (ref 1.2–2.2)
ALT: 13 IU/L (ref 0–32)
AST: 19 IU/L (ref 0–40)
Albumin: 4.6 g/dL (ref 3.5–5.5)
Alkaline Phosphatase: 41 IU/L (ref 39–117)
BILIRUBIN TOTAL: 0.4 mg/dL (ref 0.0–1.2)
BUN/Creatinine Ratio: 21 (ref 9–23)
BUN: 18 mg/dL (ref 6–20)
CHLORIDE: 94 mmol/L — AB (ref 96–106)
CO2: 25 mmol/L (ref 18–29)
Calcium: 9.4 mg/dL (ref 8.7–10.2)
Creatinine, Ser: 0.87 mg/dL (ref 0.57–1.00)
GFR calc Af Amer: 97 mL/min/{1.73_m2} (ref >59)
GFR calc non Af Amer: 84 mL/min/{1.73_m2} (ref >59)
Globulin, Total: 2.9 g/dL (ref 1.5–4.5)
Glucose: 91 mg/dL (ref 65–99)
Potassium: 4.1 mmol/L (ref 3.5–5.2)
Sodium: 133 mmol/L — ABNORMAL LOW (ref 134–144)
Total Protein: 7.5 g/dL (ref 6.0–8.5)

## 2017-03-21 LAB — URINE CULTURE

## 2017-03-21 LAB — TSH: TSH: 7.68 u[IU]/mL — ABNORMAL HIGH (ref 0.450–4.500)

## 2017-03-21 LAB — T3, FREE: T3, Free: 2.7 pg/mL (ref 2.0–4.4)

## 2017-03-21 LAB — T4, FREE: Free T4: 1.07 ng/dL (ref 0.82–1.77)

## 2017-03-21 MED ORDER — LEVOTHYROXINE SODIUM 25 MCG PO TABS: 25.0000 ug | ORAL_TABLET | Freq: Every day | ORAL | 1 refills | Status: DC

## 2017-04-06 ENCOUNTER — Ambulatory Visit (INDEPENDENT_AMBULATORY_CARE_PROVIDER_SITE_OTHER): Payer: 59 | Admitting: Primary Care

## 2017-04-06 ENCOUNTER — Encounter: Payer: Self-pay | Admitting: Primary Care

## 2017-04-06 VITALS — BP 136/94 | HR 69 | Temp 98.2°F | Ht 65.0 in | Wt 158.8 lb

## 2017-04-06 DIAGNOSIS — E039 Hypothyroidism, unspecified: Secondary | ICD-10-CM | POA: Diagnosis not present

## 2017-04-06 DIAGNOSIS — I10 Essential (primary) hypertension: Secondary | ICD-10-CM | POA: Diagnosis not present

## 2017-04-06 MED ORDER — LISINOPRIL-HYDROCHLOROTHIAZIDE 20-25 MG PO TABS: 1.0000 | ORAL_TABLET | Freq: Every day | ORAL | 0 refills | Status: DC

## 2017-04-06 NOTE — Assessment & Plan Note (Signed)
Initiated on levothyroxine 25 mcg 2 weeks ago for TSH of 7. Will recheck TSH in 3 weeks during her next appointment.

## 2017-04-06 NOTE — Assessment & Plan Note (Signed)
Slightly improved with HCTZ. Add on lisinopril 20 mg. New Rx with combination of these two medications sent to pharmacy.  Follow up in three weeks for BMP and BP check.

## 2017-04-06 NOTE — Progress Notes (Signed)
Pre visit review using our clinic review tool, if applicable. No additional management support is needed unless otherwise documented below in the visit note. 

## 2017-04-06 NOTE — Patient Instructions (Signed)
Stop hydrochlorothiazide 25 mg tablets for high blood pressure.  Start lisinopril/hydrochlorothiazide 20-25 mg tablets for high blood pressure. Take 1 tablet by mouth once daily.  Schedule a follow up visit in 3 weeks for re-evaluation of your blood pressure and labs.  It was a pleasure to see you today!

## 2017-04-06 NOTE — Progress Notes (Signed)
Subjective:    Patient ID: Sharon Jones, female    DOB: Sep 01, 1976, 41 y.o.   MRN: [id number]  HPI  [name] is a 41 year old female who presents today for follow up of hypertension. She presented as a new patient in mid April 2018 with a long history of untreated hypertension. Her BP in the clinic during her initial visit was 146/94. Given her office reading and elevated home readings she was initiated on HCTZ 25 mg.   Her BP in the office today is 136/94. She's been checking her BP at home with readings of 130's/80's-90's. She has noticed a reduction in headaches. She continues to notice swelling her legs and feet.   Review of Systems  Constitutional: Negative for fatigue.  Respiratory: Negative for shortness of breath.   Cardiovascular: Positive for leg swelling. Negative for chest pain.  Neurological: Negative for dizziness, numbness and headaches.       Past Medical History:  Diagnosis Date  . Abnormal finding on Pap smear, ASCUS 1997  . Anxiety    doesn't require meds  . Chronic neck pain    bulding disc  . Dizziness   . Graves disease   . H/O echocardiogram    pt. consulted /w Sharon Jones, 2 + yrs. ago, had ECHO due to Graves disease.   Marland Kitchen Headache    otc med prn  . Hypertension    doesn't require meds  . Hypertrophy, vulva 10/08/2014  . PONV (postoperative nausea and vomiting)   . Rash    arms and feet  . SVD (spontaneous vaginal delivery)    x 1     Social History   Social History  . Marital status: Single    Spouse name: N/A  . Number of children: N/A  . Years of education: N/A   Occupational History  . Not on file.   Social History Main Topics  . Smoking status: Former Smoker    Types: Cigarettes    Quit date: 12/04/2010  . Smokeless tobacco: Never Used     Comment: quit in mar 2013-uses EC cigarette  . Alcohol use Yes     Comment: social  . Drug use: No  . Sexual activity: Yes    Birth control/ protection: IUD   Other Topics Concern  . Not  on file   Social History Narrative   Married.   1 child.   Works at [employer].   Enjoys spending time with her family.    Past Surgical History:  Procedure Laterality Date  . ANTERIOR CERVICAL DECOMP/DISCECTOMY FUSION  10/02/2012   Procedure: ANTERIOR CERVICAL DECOMPRESSION/DISCECTOMY FUSION 1 LEVEL;  Surgeon: Sinclair Ship, MD;  Location: [facility];  Service: Orthopedics;  Laterality: Bilateral;  Anterior cervical decompression fusion, 5-6 with instrumentation, allograft.  Marland Kitchen BREAST ENHANCEMENT SURGERY    . CERVICAL CONE BIOPSY     twice  . LABIOPLASTY N/A 10/09/2014   Procedure: LABIAPLASTY;  Surgeon: Janyth Contes, MD;  Location: [facility];  Service: Gynecology;  Laterality: N/A;    Family History  Problem Relation Age of Onset  . Hypertension Mother   . Alcohol abuse Father   . Alcohol abuse Maternal Uncle   . Alcohol abuse Paternal Aunt   . Arthritis Maternal Grandmother   . Alcohol abuse Maternal Grandfather   . Arthritis Maternal Grandfather   . Arthritis Paternal Grandmother   . Arthritis Paternal Grandfather     No Known Allergies  Current Outpatient Prescriptions on File Prior to  Visit  Medication Sig Dispense Refill  . levothyroxine (SYNTHROID, LEVOTHROID) 25 MCG tablet Take 1 tablet (25 mcg total) by mouth daily before breakfast. 30 tablet 1   No current facility-administered medications on file prior to visit.     BP (!) 136/94   Pulse 69   Temp 98.2 F (36.8 C) (Oral)   Ht 5\' 5"  (1.651 m)   Wt 158 lb 12.8 oz (72 kg)   SpO2 99%   BMI 26.43 kg/m    Objective:   Physical Exam  Constitutional: She appears well-nourished.  Neck: Neck supple.  Cardiovascular: Normal rate and regular rhythm.   Mild left lower extremity edema, no pitting.  Pulmonary/Chest: Effort normal and breath sounds normal.  Skin: Skin is warm and dry.          Assessment & Plan:

## 2017-04-27 ENCOUNTER — Ambulatory Visit: Payer: 59 | Admitting: Primary Care

## 2017-05-04 ENCOUNTER — Ambulatory Visit (INDEPENDENT_AMBULATORY_CARE_PROVIDER_SITE_OTHER): Payer: 59 | Admitting: Primary Care

## 2017-05-04 ENCOUNTER — Encounter: Payer: Self-pay | Admitting: Primary Care

## 2017-05-04 VITALS — BP 128/86 | HR 72 | Temp 98.0°F | Ht 65.0 in | Wt 154.8 lb

## 2017-05-04 DIAGNOSIS — E039 Hypothyroidism, unspecified: Secondary | ICD-10-CM

## 2017-05-04 DIAGNOSIS — I10 Essential (primary) hypertension: Secondary | ICD-10-CM | POA: Diagnosis not present

## 2017-05-04 MED ORDER — LISINOPRIL-HYDROCHLOROTHIAZIDE 20-25 MG PO TABS: 1.0000 | ORAL_TABLET | Freq: Every day | ORAL | 2 refills | Status: DC

## 2017-05-04 NOTE — Progress Notes (Signed)
Subjective:    Patient ID: Sharon Jones, female    DOB: 14-Sep-1976, 41 y.o.   MRN: [id number]  HPI  [name] is a 41 year old female who presents today for follow up.  1) Hypothyroidism: TSH in late April of 7. She was initiated on levothyroxine 25 mcg per another provider two weeks prior to her visit at our clinic in early May 2018. She is due for repeat TSH today.  2) Essential Hypertension: Currently managed on lisinopril/HCTZ 20/25 mg tablets. During her last visit her BP wasn't well controlled on HCTZ alone so we added lisinopril 20 mg.   Since her last visit she's compliant to her lisinopril/HCTZ. Her BP in the office today is 128/86. She's not checking her BP at home. She's had an improvement in dizziness and feeling "off". She denies chest pain, headaches. Overall she's feeling much better.  Review of Systems  Constitutional: Negative for fatigue.  Eyes: Negative for visual disturbance.  Respiratory: Negative for shortness of breath.   Cardiovascular: Negative for chest pain.  Neurological: Negative for dizziness and headaches.       Past Medical History:  Diagnosis Date  . Abnormal finding on Pap smear, ASCUS 1997  . Anxiety    doesn't require meds  . Chronic neck pain    bulding disc  . Dizziness   . Graves disease   . H/O echocardiogram    pt. consulted /w Dr. Acie Fredrickson, 2 + yrs. ago, had ECHO due to Graves disease.   Marland Kitchen Headache    otc med prn  . Hypertension    doesn't require meds  . Hypertrophy, vulva 10/08/2014  . PONV (postoperative nausea and vomiting)   . Rash    arms and feet  . SVD (spontaneous vaginal delivery)    x 1     Social History   Social History  . Marital status: Single    Spouse name: N/A  . Number of children: N/A  . Years of education: N/A   Occupational History  . Not on file.   Social History Main Topics  . Smoking status: Former Smoker    Types: Cigarettes    Quit date: 12/04/2010  . Smokeless tobacco: Never Used   Comment: quit in mar 2013-uses EC cigarette  . Alcohol use Yes     Comment: social  . Drug use: No  . Sexual activity: Yes    Birth control/ protection: IUD   Other Topics Concern  . Not on file   Social History Narrative   Married.   1 child.   Works at [employer].   Enjoys spending time with her family.    Past Surgical History:  Procedure Laterality Date  . ANTERIOR CERVICAL DECOMP/DISCECTOMY FUSION  10/02/2012   Procedure: ANTERIOR CERVICAL DECOMPRESSION/DISCECTOMY FUSION 1 LEVEL;  Surgeon: Sinclair Ship, MD;  Location: [facility];  Service: Orthopedics;  Laterality: Bilateral;  Anterior cervical decompression fusion, 5-6 with instrumentation, allograft.  Marland Kitchen BREAST ENHANCEMENT SURGERY    . CERVICAL CONE BIOPSY     twice  . LABIOPLASTY N/A 10/09/2014   Procedure: LABIAPLASTY;  Surgeon: Janyth Contes, MD;  Location: [facility];  Service: Gynecology;  Laterality: N/A;    Family History  Problem Relation Age of Onset  . Hypertension Mother   . Alcohol abuse Father   . Alcohol abuse Maternal Uncle   . Alcohol abuse Paternal Aunt   . Arthritis Maternal Grandmother   . Alcohol abuse Maternal Grandfather   . Arthritis Maternal  Grandfather   . Arthritis Paternal Grandmother   . Arthritis Paternal Grandfather     No Known Allergies  Current Outpatient Prescriptions on File Prior to Visit  Medication Sig Dispense Refill  . levothyroxine (SYNTHROID, LEVOTHROID) 25 MCG tablet Take 1 tablet (25 mcg total) by mouth daily before breakfast. 30 tablet 1   No current facility-administered medications on file prior to visit.     BP 128/86   Pulse 72   Temp 98 F (36.7 C) (Oral)   Ht 5\' 5"  (1.651 m)   Wt 154 lb 12.8 oz (70.2 kg)   SpO2 98%   BMI 25.76 kg/m    Objective:   Physical Exam  Constitutional: She appears well-nourished.  Neck: Neck supple. No thyromegaly present.  Cardiovascular: Normal rate and regular rhythm.   Pulmonary/Chest: Effort normal and breath sounds  normal.  Skin: Skin is warm and dry.          Assessment & Plan:

## 2017-05-04 NOTE — Patient Instructions (Addendum)
Complete lab work prior to leaving today. I will notify you of your results once received.   Continue lisinopril/hydrochlorothiazide 20/25 mg tablets for high blood pressure.  It was a pleasure to see you today!

## 2017-05-04 NOTE — Assessment & Plan Note (Signed)
Due for repeat TSH today, pending. She is compliant to her levothyroxine.

## 2017-05-04 NOTE — Assessment & Plan Note (Signed)
At goal with addition of lisinopril 20 mg. Continue lisinopril/HCTZ 20/25 mg daily. BMP pending.

## 2017-05-07 DIAGNOSIS — E039 Hypothyroidism, unspecified: Secondary | ICD-10-CM | POA: Diagnosis not present

## 2017-05-07 DIAGNOSIS — I10 Essential (primary) hypertension: Secondary | ICD-10-CM | POA: Diagnosis not present

## 2017-05-08 ENCOUNTER — Encounter: Payer: Self-pay | Admitting: *Deleted

## 2017-05-08 LAB — BASIC METABOLIC PANEL
BUN/Creatinine Ratio: 20 (ref 9–23)
BUN: 17 mg/dL (ref 6–20)
CO2: 23 mmol/L (ref 18–29)
Calcium: 9.3 mg/dL (ref 8.7–10.2)
Chloride: 100 mmol/L (ref 96–106)
Creatinine, Ser: 0.87 mg/dL (ref 0.57–1.00)
GFR calc Af Amer: 97 mL/min/{1.73_m2} (ref >59)
GFR calc non Af Amer: 84 mL/min/{1.73_m2} (ref >59)
Glucose: 95 mg/dL (ref 65–99)
Potassium: 4.3 mmol/L (ref 3.5–5.2)
Sodium: 138 mmol/L (ref 134–144)

## 2017-05-08 LAB — PLEASE NOTE

## 2017-05-08 LAB — TSH: TSH: 2.97 u[IU]/mL (ref 0.450–4.500)

## 2017-06-01 ENCOUNTER — Other Ambulatory Visit: Payer: Self-pay | Admitting: Primary Care

## 2017-06-01 DIAGNOSIS — E039 Hypothyroidism, unspecified: Secondary | ICD-10-CM

## 2017-07-12 ENCOUNTER — Other Ambulatory Visit: Payer: Self-pay | Admitting: *Deleted

## 2017-07-12 DIAGNOSIS — J019 Acute sinusitis, unspecified: Secondary | ICD-10-CM

## 2017-07-12 MED ORDER — AZITHROMYCIN 250 MG PO TABS: ORAL_TABLET | ORAL | 1 refills | Status: DC

## 2017-09-26 ENCOUNTER — Other Ambulatory Visit: Payer: Self-pay

## 2017-09-26 ENCOUNTER — Other Ambulatory Visit: Payer: Self-pay | Admitting: Student

## 2017-09-26 DIAGNOSIS — R309 Painful micturition, unspecified: Secondary | ICD-10-CM

## 2017-09-26 MED ORDER — SULFAMETHOXAZOLE-TRIMETHOPRIM 800-160 MG PO TABS: 1.0000 | ORAL_TABLET | Freq: Two times a day (BID) | ORAL | 0 refills | Status: DC

## 2017-09-26 NOTE — Telephone Encounter (Signed)
Patient thinks she has a uti and is requesting medications be called into the pharmacy. Urine showed positive for leuk and positive for blood. Patient is requesting Bactrim be called into the pharmacy.

## 2018-01-10 ENCOUNTER — Other Ambulatory Visit: Payer: Self-pay

## 2018-01-10 MED ORDER — OSELTAMIVIR PHOSPHATE 75 MG PO CAPS: 75.0000 mg | ORAL_CAPSULE | Freq: Every day | ORAL | 0 refills | Status: DC

## 2018-01-10 NOTE — Telephone Encounter (Signed)
Patient was expose to the flu. She is requesting Tamiflu for x 5 days.

## 2018-03-18 ENCOUNTER — Other Ambulatory Visit: Payer: Self-pay | Admitting: Primary Care

## 2018-03-18 DIAGNOSIS — E039 Hypothyroidism, unspecified: Secondary | ICD-10-CM

## 2018-03-18 DIAGNOSIS — I10 Essential (primary) hypertension: Secondary | ICD-10-CM

## 2018-04-12 DIAGNOSIS — Z124 Encounter for screening for malignant neoplasm of cervix: Secondary | ICD-10-CM | POA: Diagnosis not present

## 2018-04-12 DIAGNOSIS — Z1231 Encounter for screening mammogram for malignant neoplasm of breast: Secondary | ICD-10-CM | POA: Diagnosis not present

## 2018-04-12 DIAGNOSIS — Z1389 Encounter for screening for other disorder: Secondary | ICD-10-CM | POA: Diagnosis not present

## 2018-04-12 DIAGNOSIS — R5383 Other fatigue: Secondary | ICD-10-CM | POA: Diagnosis not present

## 2018-04-12 DIAGNOSIS — Z6825 Body mass index (BMI) 25.0-25.9, adult: Secondary | ICD-10-CM | POA: Diagnosis not present

## 2018-04-12 DIAGNOSIS — Z202 Contact with and (suspected) exposure to infections with a predominantly sexual mode of transmission: Secondary | ICD-10-CM | POA: Diagnosis not present

## 2018-04-12 DIAGNOSIS — Z30433 Encounter for removal and reinsertion of intrauterine contraceptive device: Secondary | ICD-10-CM | POA: Diagnosis not present

## 2018-04-12 DIAGNOSIS — Z113 Encounter for screening for infections with a predominantly sexual mode of transmission: Secondary | ICD-10-CM | POA: Diagnosis not present

## 2018-04-12 DIAGNOSIS — Z01419 Encounter for gynecological examination (general) (routine) without abnormal findings: Secondary | ICD-10-CM | POA: Diagnosis not present

## 2018-04-28 ENCOUNTER — Emergency Department (HOSPITAL_COMMUNITY): Payer: 59

## 2018-04-28 ENCOUNTER — Emergency Department (HOSPITAL_COMMUNITY)
Admission: EM | Admit: 2018-04-28 | Discharge: 2018-04-29 | Disposition: A | Discharge status: Home / Self Care | Payer: 59 | Attending: Emergency Medicine | Admitting: Emergency Medicine

## 2018-04-28 ENCOUNTER — Encounter (HOSPITAL_COMMUNITY): Payer: Self-pay | Admitting: Emergency Medicine

## 2018-04-28 ENCOUNTER — Other Ambulatory Visit: Payer: Self-pay

## 2018-04-28 DIAGNOSIS — Z87891 Personal history of nicotine dependence: Secondary | ICD-10-CM | POA: Insufficient documentation

## 2018-04-28 DIAGNOSIS — S20212A Contusion of left front wall of thorax, initial encounter: Secondary | ICD-10-CM | POA: Diagnosis not present

## 2018-04-28 DIAGNOSIS — S99911A Unspecified injury of right ankle, initial encounter: Secondary | ICD-10-CM | POA: Diagnosis present

## 2018-04-28 DIAGNOSIS — Y939 Activity, unspecified: Secondary | ICD-10-CM | POA: Diagnosis not present

## 2018-04-28 DIAGNOSIS — S0083XA Contusion of other part of head, initial encounter: Secondary | ICD-10-CM | POA: Insufficient documentation

## 2018-04-28 DIAGNOSIS — E039 Hypothyroidism, unspecified: Secondary | ICD-10-CM | POA: Diagnosis not present

## 2018-04-28 DIAGNOSIS — Y998 Other external cause status: Secondary | ICD-10-CM | POA: Diagnosis not present

## 2018-04-28 DIAGNOSIS — Z79899 Other long term (current) drug therapy: Secondary | ICD-10-CM | POA: Diagnosis not present

## 2018-04-28 DIAGNOSIS — I10 Essential (primary) hypertension: Secondary | ICD-10-CM | POA: Diagnosis not present

## 2018-04-28 DIAGNOSIS — S93401A Sprain of unspecified ligament of right ankle, initial encounter: Secondary | ICD-10-CM | POA: Diagnosis not present

## 2018-04-28 DIAGNOSIS — M7989 Other specified soft tissue disorders: Secondary | ICD-10-CM | POA: Diagnosis not present

## 2018-04-28 DIAGNOSIS — Y929 Unspecified place or not applicable: Secondary | ICD-10-CM | POA: Insufficient documentation

## 2018-04-28 DIAGNOSIS — S199XXA Unspecified injury of neck, initial encounter: Secondary | ICD-10-CM | POA: Diagnosis not present

## 2018-04-28 DIAGNOSIS — S299XXA Unspecified injury of thorax, initial encounter: Secondary | ICD-10-CM | POA: Diagnosis not present

## 2018-04-28 DIAGNOSIS — M25571 Pain in right ankle and joints of right foot: Secondary | ICD-10-CM | POA: Diagnosis not present

## 2018-04-28 MED ORDER — SODIUM CHLORIDE 0.9 % IV BOLUS
1000.0000 mL | Freq: Once | INTRAVENOUS | Status: AC
  Administered 2018-04-28: 1000 mL via INTRAVENOUS
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
## 2018-04-29 ENCOUNTER — Emergency Department (HOSPITAL_COMMUNITY): Payer: 59

## 2018-04-29 DIAGNOSIS — S93401A Sprain of unspecified ligament of right ankle, initial encounter: Secondary | ICD-10-CM | POA: Diagnosis not present

## 2018-04-29 DIAGNOSIS — S299XXA Unspecified injury of thorax, initial encounter: Secondary | ICD-10-CM | POA: Diagnosis not present

## 2018-04-29 DIAGNOSIS — M7989 Other specified soft tissue disorders: Secondary | ICD-10-CM | POA: Diagnosis not present

## 2018-04-29 DIAGNOSIS — S199XXA Unspecified injury of neck, initial encounter: Secondary | ICD-10-CM | POA: Diagnosis not present

## 2018-04-29 DIAGNOSIS — S99911A Unspecified injury of right ankle, initial encounter: Secondary | ICD-10-CM | POA: Diagnosis not present

## 2018-04-29 DIAGNOSIS — M25571 Pain in right ankle and joints of right foot: Secondary | ICD-10-CM | POA: Diagnosis not present

## 2018-04-29 DIAGNOSIS — S0083XA Contusion of other part of head, initial encounter: Secondary | ICD-10-CM | POA: Diagnosis not present

## 2018-04-29 DIAGNOSIS — Z87891 Personal history of nicotine dependence: Secondary | ICD-10-CM | POA: Diagnosis not present

## 2018-04-29 DIAGNOSIS — Z79899 Other long term (current) drug therapy: Secondary | ICD-10-CM | POA: Diagnosis not present

## 2018-04-29 DIAGNOSIS — I10 Essential (primary) hypertension: Secondary | ICD-10-CM | POA: Diagnosis not present

## 2018-04-29 DIAGNOSIS — S20212A Contusion of left front wall of thorax, initial encounter: Secondary | ICD-10-CM | POA: Diagnosis not present

## 2018-04-29 DIAGNOSIS — E039 Hypothyroidism, unspecified: Secondary | ICD-10-CM | POA: Diagnosis not present

## 2018-04-29 LAB — I-STAT CHEM 8, ED
BUN: 13 mg/dL (ref 6–20)
CALCIUM ION: 1.14 mmol/L — AB (ref 1.15–1.40)
Chloride: 101 mmol/L (ref 101–111)
Creatinine, Ser: 0.9 mg/dL (ref 0.44–1.00)
Glucose, Bld: 107 mg/dL — ABNORMAL HIGH (ref 65–99)
HEMATOCRIT: 44 % (ref 36.0–46.0)
HEMOGLOBIN: 15 g/dL (ref 12.0–15.0)
Potassium: 3.5 mmol/L (ref 3.5–5.1)
Sodium: 140 mmol/L (ref 135–145)
TCO2: 25 mmol/L (ref 22–32)

## 2018-04-29 LAB — I-STAT BETA HCG BLOOD, ED (MC, WL, AP ONLY)

## 2018-04-29 MED ORDER — KETOROLAC TROMETHAMINE 30 MG/ML IJ SOLN
30.0000 mg | Freq: Once | INTRAMUSCULAR | Status: AC
  Administered 2018-04-29: 30 mg via INTRAVENOUS
  Filled 2018-04-29: qty 1

## 2018-04-29 MED ORDER — NAPROXEN 500 MG PO TABS: 500.0000 mg | ORAL_TABLET | Freq: Two times a day (BID) | ORAL | 0 refills | Status: DC

## 2018-04-29 MED ORDER — METHOCARBAMOL 500 MG PO TABS: 500.0000 mg | ORAL_TABLET | Freq: Two times a day (BID) | ORAL | 0 refills | Status: DC | PRN

## 2018-04-29 MED ORDER — METHOCARBAMOL 500 MG PO TABS
500.0000 mg | ORAL_TABLET | Freq: Once | ORAL | Status: AC
  Administered 2018-04-29: 500 mg via ORAL
  Filled 2018-04-29: qty 1

## 2018-04-29 MED ORDER — IOHEXOL 300 MG/ML  SOLN
75.0000 mL | Freq: Once | INTRAMUSCULAR | Status: AC | PRN
  Administered 2018-04-29: 75 mL via INTRAVENOUS
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
## 2018-04-30 ENCOUNTER — Other Ambulatory Visit: Payer: Self-pay

## 2018-04-30 ENCOUNTER — Encounter (HOSPITAL_BASED_OUTPATIENT_CLINIC_OR_DEPARTMENT_OTHER): Payer: Self-pay | Admitting: Emergency Medicine

## 2018-04-30 ENCOUNTER — Emergency Department (HOSPITAL_BASED_OUTPATIENT_CLINIC_OR_DEPARTMENT_OTHER): Payer: 59

## 2018-04-30 ENCOUNTER — Emergency Department (HOSPITAL_BASED_OUTPATIENT_CLINIC_OR_DEPARTMENT_OTHER)
Admission: EM | Admit: 2018-04-30 | Discharge: 2018-04-30 | Disposition: A | Discharge status: Home / Self Care | Payer: 59 | Attending: Emergency Medicine | Admitting: Emergency Medicine

## 2018-04-30 DIAGNOSIS — S0990XA Unspecified injury of head, initial encounter: Secondary | ICD-10-CM | POA: Diagnosis present

## 2018-04-30 DIAGNOSIS — E039 Hypothyroidism, unspecified: Secondary | ICD-10-CM | POA: Insufficient documentation

## 2018-04-30 DIAGNOSIS — S060X0A Concussion without loss of consciousness, initial encounter: Secondary | ICD-10-CM | POA: Diagnosis not present

## 2018-04-30 DIAGNOSIS — Y929 Unspecified place or not applicable: Secondary | ICD-10-CM | POA: Insufficient documentation

## 2018-04-30 DIAGNOSIS — Y939 Activity, unspecified: Secondary | ICD-10-CM | POA: Diagnosis not present

## 2018-04-30 DIAGNOSIS — R51 Headache: Secondary | ICD-10-CM | POA: Diagnosis not present

## 2018-04-30 DIAGNOSIS — Y998 Other external cause status: Secondary | ICD-10-CM | POA: Diagnosis not present

## 2018-04-30 DIAGNOSIS — I10 Essential (primary) hypertension: Secondary | ICD-10-CM | POA: Insufficient documentation

## 2018-04-30 DIAGNOSIS — Z79899 Other long term (current) drug therapy: Secondary | ICD-10-CM | POA: Diagnosis not present

## 2018-04-30 DIAGNOSIS — R112 Nausea with vomiting, unspecified: Secondary | ICD-10-CM | POA: Diagnosis not present

## 2018-04-30 DIAGNOSIS — Z87891 Personal history of nicotine dependence: Secondary | ICD-10-CM | POA: Insufficient documentation

## 2018-04-30 MED ORDER — ONDANSETRON 4 MG PO TBDP: ORAL_TABLET | ORAL | 0 refills | Status: DC
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
## 2018-07-08 ENCOUNTER — Other Ambulatory Visit: Payer: Self-pay | Admitting: Primary Care

## 2018-07-08 DIAGNOSIS — I10 Essential (primary) hypertension: Secondary | ICD-10-CM

## 2018-08-02 DIAGNOSIS — Z0100 Encounter for examination of eyes and vision without abnormal findings: Secondary | ICD-10-CM | POA: Diagnosis not present

## 2018-08-30 ENCOUNTER — Encounter: Payer: Self-pay | Admitting: Primary Care

## 2018-08-30 ENCOUNTER — Ambulatory Visit (INDEPENDENT_AMBULATORY_CARE_PROVIDER_SITE_OTHER): Payer: 59 | Admitting: Primary Care

## 2018-08-30 DIAGNOSIS — I10 Essential (primary) hypertension: Secondary | ICD-10-CM | POA: Diagnosis not present

## 2018-08-30 DIAGNOSIS — E039 Hypothyroidism, unspecified: Secondary | ICD-10-CM

## 2018-08-30 LAB — COMPREHENSIVE METABOLIC PANEL
AG Ratio: 1.8 (calc) (ref 1.0–2.5)
ALBUMIN MSPROF: 4.5 g/dL (ref 3.6–5.1)
ALKALINE PHOSPHATASE (APISO): 32 U/L — AB (ref 33–115)
ALT: 13 U/L (ref 6–29)
AST: 17 U/L (ref 10–30)
BILIRUBIN TOTAL: 0.4 mg/dL (ref 0.2–1.2)
BUN/Creatinine Ratio: 17 (calc) (ref 6–22)
BUN: 20 mg/dL (ref 7–25)
CALCIUM: 9.5 mg/dL (ref 8.6–10.2)
CHLORIDE: 102 mmol/L (ref 98–110)
CO2: 25 mmol/L (ref 20–32)
Creat: 1.16 mg/dL — ABNORMAL HIGH (ref 0.50–1.10)
GLOBULIN: 2.5 g/dL (ref 1.9–3.7)
Glucose, Bld: 88 mg/dL (ref 65–99)
POTASSIUM: 4.1 mmol/L (ref 3.5–5.3)
Sodium: 136 mmol/L (ref 135–146)
Total Protein: 7 g/dL (ref 6.1–8.1)

## 2018-08-30 LAB — TSH: TSH: 2.53 m[IU]/L

## 2018-08-30 MED ORDER — LISINOPRIL-HYDROCHLOROTHIAZIDE 20-25 MG PO TABS: ORAL_TABLET | ORAL | 3 refills | Status: DC

## 2018-08-30 MED ORDER — LEVOTHYROXINE SODIUM 25 MCG PO TABS: ORAL_TABLET | ORAL | 0 refills | Status: DC

## 2018-08-30 NOTE — Progress Notes (Signed)
Subjective:    Patient ID: Sharon Jones, female    DOB: 19-Oct-1976, 42 y.o.   MRN: [id number]  HPI  [name] is a 42 year old female who presents today for follow up. She's not been seen since June 2018.  1) Hypothyroidism: Currently managed on levothyroxine 25 mcg. Her last TSH was 2.9 in June 2018. No recent TSH on file. She ran out of her levothyroxine 2-3 weeks ago. She takes her levothyroxine with her blood pressure medication. She doesn't eat within 30 minutes of taking. Sometimes takes with sweet tea, mostly water.   2) Essential Hypertension: Currently managed on lisinopril-hydrochlorothiazide 20-25 mg. She's taking daily for the most part. She denies chest pain, dizziness, shortness of breath, cough.   BP Readings from Last 3 Encounters:  08/30/18 118/84  04/30/18 (!) 146/103  04/29/18 125/83     Review of Systems  Eyes: Negative for visual disturbance.  Respiratory: Negative for shortness of breath.   Cardiovascular: Negative for chest pain.  Neurological: Negative for dizziness and headaches.       Past Medical History:  Diagnosis Date  . Abnormal finding on Pap smear, ASCUS 1997  . Anxiety    doesn't require meds  . Chronic neck pain    bulding disc  . Dizziness   . Graves disease   . H/O echocardiogram    pt. consulted /w Dr. Acie Fredrickson, 2 + yrs. ago, had ECHO due to Graves disease.   Marland Kitchen Headache    otc med prn  . Hypertension    doesn't require meds  . Hypertrophy, vulva 10/08/2014  . PONV (postoperative nausea and vomiting)   . Rash    arms and feet  . SVD (spontaneous vaginal delivery)    x 1     Social History   Socioeconomic History  . Marital status: Married    Spouse name: Not on file  . Number of children: Not on file  . Years of education: Not on file  . Highest education level: Not on file  Occupational History  . Not on file  Social Needs  . Financial resource strain: Not on file  . Food insecurity:    Worry: Not on file   Inability: Not on file  . Transportation needs:    Medical: Not on file    Non-medical: Not on file  Tobacco Use  . Smoking status: Former Smoker    Types: Cigarettes    Last attempt to quit: 12/04/2010    Years since quitting: 7.7  . Smokeless tobacco: Never Used  . Tobacco comment: quit in mar 2013-uses EC cigarette  Substance and Sexual Activity  . Alcohol use: Yes    Comment: 2-3 glasses of wine daily  . Drug use: No  . Sexual activity: Yes    Birth control/protection: IUD  Lifestyle  . Physical activity:    Days per week: Not on file    Minutes per session: Not on file  . Stress: Not on file  Relationships  . Social connections:    Talks on phone: Not on file    Gets together: Not on file    Attends religious service: Not on file    Active member of club or organization: Not on file    Attends meetings of clubs or organizations: Not on file    Relationship status: Not on file  . Intimate partner violence:    Fear of current or ex partner: Not on file    Emotionally abused:  Not on file    Physically abused: Not on file    Forced sexual activity: Not on file  Other Topics Concern  . Not on file  Social History Narrative   Married.   1 child.   Works at [employer].   Enjoys spending time with her family.    Past Surgical History:  Procedure Laterality Date  . ANTERIOR CERVICAL DECOMP/DISCECTOMY FUSION  10/02/2012   Procedure: ANTERIOR CERVICAL DECOMPRESSION/DISCECTOMY FUSION 1 LEVEL;  Surgeon: Sinclair Ship, MD;  Location: [facility];  Service: Orthopedics;  Laterality: Bilateral;  Anterior cervical decompression fusion, 5-6 with instrumentation, allograft.  Marland Kitchen BREAST ENHANCEMENT SURGERY    . CERVICAL CONE BIOPSY     twice  . LABIOPLASTY N/A 10/09/2014   Procedure: LABIAPLASTY;  Surgeon: Janyth Contes, MD;  Location: [facility];  Service: Gynecology;  Laterality: N/A;    Family History  Problem Relation Age of Onset  . Hypertension Mother   . Alcohol abuse  Father   . Alcohol abuse Maternal Uncle   . Alcohol abuse Paternal Aunt   . Arthritis Maternal Grandmother   . Alcohol abuse Maternal Grandfather   . Arthritis Maternal Grandfather   . Arthritis Paternal Grandmother   . Arthritis Paternal Grandfather     No Known Allergies  Current Outpatient Medications on File Prior to Visit  Medication Sig Dispense Refill  . cetirizine (ZYRTEC) 10 MG tablet Take 10 mg by mouth daily.     No current facility-administered medications on file prior to visit.     BP 118/84   Pulse 75   Temp 98.9 F (37.2 C) (Oral)   Ht 5\' 6"  (1.676 m)   Wt 154 lb 4 oz (70 kg)   SpO2 99%   BMI 24.90 kg/m    Objective:   Physical Exam  Constitutional: She appears well-nourished.  Neck: Neck supple.  Cardiovascular: Normal rate and regular rhythm.  Respiratory: Effort normal and breath sounds normal.  Skin: Skin is warm and dry.           Assessment & Plan:

## 2018-08-30 NOTE — Assessment & Plan Note (Signed)
Repeat TSH pending. Discussed to take her levothyroxine with water only, no food or other medications for 30 minutes.   Suspect TSH may be too high given lack of medication for the last 2-3 weeks. May need to repeat once treatment has been re-initiated.

## 2018-08-30 NOTE — Patient Instructions (Signed)
Stop by the lab prior to leaving today. I will notify you of your results once received.   Take the levothyroxine every morning on an empty stomach with water only. No food or other medications for 30 minutes.   I sent refills to the [facility].  It was a pleasure to see you today!

## 2018-08-30 NOTE — Assessment & Plan Note (Signed)
Stable in the office today, continue current regimen. CMP pending. Refill sent to pharmacy.

## 2018-09-03 ENCOUNTER — Ambulatory Visit (INDEPENDENT_AMBULATORY_CARE_PROVIDER_SITE_OTHER): Payer: 59

## 2018-09-03 VITALS — BP 115/72 | HR 72

## 2018-09-03 DIAGNOSIS — Z23 Encounter for immunization: Secondary | ICD-10-CM | POA: Diagnosis not present

## 2018-11-29 ENCOUNTER — Other Ambulatory Visit: Payer: Self-pay | Admitting: *Deleted

## 2018-11-29 MED ORDER — SULFAMETHOXAZOLE-TRIMETHOPRIM 800-160 MG PO TABS: 1.0000 | ORAL_TABLET | Freq: Two times a day (BID) | ORAL | 0 refills | Status: DC

## 2018-12-19 ENCOUNTER — Other Ambulatory Visit: Payer: Self-pay | Admitting: Family Medicine

## 2018-12-19 MED ORDER — BENZONATATE 100 MG PO CAPS: 100.0000 mg | ORAL_CAPSULE | Freq: Three times a day (TID) | ORAL | 0 refills | Status: DC | PRN

## 2018-12-19 NOTE — Progress Notes (Signed)
Has persistent cough following URI--rx sent in.

## 2019-02-12 NOTE — Progress Notes (Signed)
[facility] [address] [place] [place], [place] [address] Phone: [phone] Subjective:   Sharon Jones, am serving as a scribe for Dr. Hulan Saas.    CC: Neck pain follow-up  DPO:[id number]  Sharon Jones is a 43 y.o. female coming in with complaint of neck pain. History of cervical decompression/fusion 2013  and head injury 2019. Patient has been having tension and more frequent headaches since 2019. Notes a popping in her neck with stretching. Use heat and IBU. Denies any radiating symptoms. Pain is dull and tension like she states. History of Graves disease.  Patient states that she is also having difficulty with hypothyroidism and sometimes seems to be associated with some of her headaches as well.  Has not noticed any association with food or menstruation.    Past Medical History:  Diagnosis Date  . Abnormal finding on Pap smear, ASCUS 1997  . Anxiety    doesn't require meds  . Chronic neck pain    bulding disc  . Dizziness   . Graves disease   . H/O echocardiogram    pt. consulted /w Dr. Acie Fredrickson, 2 + yrs. ago, had ECHO due to Graves disease.   Marland Kitchen Headache    otc med prn  . Hypertension    doesn't require meds  . Hypertrophy, vulva 10/08/2014  . PONV (postoperative nausea and vomiting)   . Rash    arms and feet  . SVD (spontaneous vaginal delivery)    x 1   Past Surgical History:  Procedure Laterality Date  . ANTERIOR CERVICAL DECOMP/DISCECTOMY FUSION  10/02/2012   Procedure: ANTERIOR CERVICAL DECOMPRESSION/DISCECTOMY FUSION 1 LEVEL;  Surgeon: Sinclair Ship, MD;  Location: [facility];  Service: Orthopedics;  Laterality: Bilateral;  Anterior cervical decompression fusion, 5-6 with instrumentation, allograft.  Marland Kitchen BREAST ENHANCEMENT SURGERY    . CERVICAL CONE BIOPSY     twice  . LABIOPLASTY N/A 10/09/2014   Procedure: LABIAPLASTY;  Surgeon: Janyth Contes, MD;  Location: [facility];  Service: Gynecology;  Laterality: N/A;    Social History   Socioeconomic History  . Marital status: Married    Spouse name: Not on file  . Number of children: Not on file  . Years of education: Not on file  . Highest education level: Not on file  Occupational History  . Not on file  Social Needs  . Financial resource strain: Not on file  . Food insecurity:    Worry: Not on file    Inability: Not on file  . Transportation needs:    Medical: Not on file    Non-medical: Not on file  Tobacco Use  . Smoking status: Former Smoker    Types: Cigarettes    Last attempt to quit: 12/04/2010    Years since quitting: 8.2  . Smokeless tobacco: Never Used  . Tobacco comment: quit in mar 2013-uses EC cigarette  Substance and Sexual Activity  . Alcohol use: Yes    Comment: 2-3 glasses of wine daily  . Drug use: Jones  . Sexual activity: Yes    Birth control/protection: I.U.D.  Lifestyle  . Physical activity:    Days per week: Not on file    Minutes per session: Not on file  . Stress: Not on file  Relationships  . Social connections:    Talks on phone: Not on file    Gets together: Not on file    Attends religious service: Not on file    Active member of  club or organization: Not on file    Attends meetings of clubs or organizations: Not on file    Relationship status: Not on file  Other Topics Concern  . Not on file  Social History Narrative   Married.   1 child.   Works at [employer].   Enjoys spending time with her family.   Jones Known Allergies Family History  Problem Relation Age of Onset  . Hypertension Mother   . Alcohol abuse Father   . Alcohol abuse Maternal Uncle   . Alcohol abuse Paternal Aunt   . Arthritis Maternal Grandmother   . Alcohol abuse Maternal Grandfather   . Arthritis Maternal Grandfather   . Arthritis Paternal Grandmother   . Arthritis Paternal Grandfather     Current Outpatient Medications (Endocrine & Metabolic):  .  levothyroxine (SYNTHROID, LEVOTHROID) 25 MCG tablet, Take 1 tablet by mouth  every morning on an empty stomach with water. Jones food or other medications for 30 minutes.  Current Outpatient Medications (Cardiovascular):  .  lisinopril-hydrochlorothiazide (PRINZIDE,ZESTORETIC) 20-25 MG tablet, Take 1 tablet by mouth once daily for blood pressure.  Current Outpatient Medications (Respiratory):  .  benzonatate (TESSALON PERLES) 100 MG capsule, Take 1 capsule (100 mg total) by mouth 3 (three) times daily as needed for cough. .  cetirizine (ZYRTEC) 10 MG tablet, Take 10 mg by mouth daily.    Current Outpatient Medications (Other):  .  sulfamethoxazole-trimethoprim (BACTRIM DS,SEPTRA DS) 800-160 MG tablet, Take 1 tablet by mouth 2 (two) times daily. .  Vitamin D, Ergocalciferol, (DRISDOL) 1.25 MG (50000 UT) CAPS capsule, Take 1 capsule (50,000 Units total) by mouth every 7 (seven) days.    Past medical history, social, surgical and family history all reviewed in electronic medical record.  Jones pertanent information unless stated regarding to the chief complaint.   Review of Systems:  Jones headache, visual changes, nausea, vomiting, diarrhea, constipation, dizziness, abdominal pain, skin rash, fevers, chills, night sweats, weight loss, swollen lymph nodes, body aches, joint swelling, muscle aches, chest pain, shortness of breath, mood changes.   Objective  Blood pressure (!) 138/110, pulse 100, height 5\' 6"  (1.676 m), weight 155 lb (70.3 kg), SpO2 98 %.    General: Jones apparent distress alert and oriented x3 mood and affect normal, dressed appropriately.  HEENT: Pupils equal, extraocular movements intact  Respiratory: Patient's speak in full sentences and does not appear short of breath  Cardiovascular: Jones lower extremity edema, non tender, Jones erythema  Skin: Warm dry intact with Jones signs of infection or rash on extremities or on axial skeleton.  Abdomen: Soft nontender  Neuro: Cranial nerves II through XII are intact, neurovascularly intact in all extremities with 2+ DTRs  and 2+ pulses.  Lymph: Jones lymphadenopathy of posterior or anterior cervical chain or axillae bilaterally.  Gait normal with good balance and coordination.  MSK:  Non tender with full range of motion and good stability and symmetric strength and tone of shoulders, elbows, wrist, hip, knee and ankles bilaterally.  Neck: Inspection of lordosis. Jones palpable stepoffs. Negative Spurling's maneuver. Patient range of motion lacking last 10 degrees of flexion lacks 5 degrees of extension Grip strength and sensation normal in bilateral hands Strength good C4 to T1 distribution Jones sensory change to C4 to T1 Negative Hoffman sign bilaterally Reflexes normal Tightness of the right trapezius noted  Osteopathic findings C5 flexed rotated and side bent left T4 extended rotated and side bent right inhaled rib T9 extended rotated and side bent left  L2 flexed rotated and side bent right Sacrum right on right    Impression and Recommendations:     This case required medical decision making of moderate complexity. The above documentation has been reviewed and is accurate and complete Lyndal Pulley, DO       Note: This dictation was prepared with Dragon dictation along with smaller phrase technology. Any transcriptional errors that result from this process are unintentional.

## 2019-02-13 ENCOUNTER — Ambulatory Visit: Payer: BLUE CROSS/BLUE SHIELD | Admitting: Family Medicine

## 2019-02-13 ENCOUNTER — Other Ambulatory Visit: Payer: Self-pay

## 2019-02-13 ENCOUNTER — Encounter: Payer: Self-pay | Admitting: Family Medicine

## 2019-02-13 VITALS — BP 138/110 | HR 100 | Ht 66.0 in | Wt 155.0 lb

## 2019-02-13 DIAGNOSIS — G4486 Cervicogenic headache: Secondary | ICD-10-CM | POA: Insufficient documentation

## 2019-02-13 DIAGNOSIS — G8929 Other chronic pain: Secondary | ICD-10-CM

## 2019-02-13 DIAGNOSIS — R51 Headache: Secondary | ICD-10-CM | POA: Diagnosis not present

## 2019-02-13 DIAGNOSIS — M999 Biomechanical lesion, unspecified: Secondary | ICD-10-CM

## 2019-02-13 HISTORY — DX: Biomechanical lesion, unspecified: M99.9

## 2019-02-13 HISTORY — DX: Cervicogenic headache: G44.86

## 2019-02-13 MED ORDER — VITAMIN D (ERGOCALCIFEROL) 1.25 MG (50000 UNIT) PO CAPS: 50000.0000 [IU] | ORAL_CAPSULE | ORAL | 0 refills | Status: DC
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
## 2019-02-13 NOTE — Assessment & Plan Note (Signed)
Decision today to treat with OMT was based on Physical Exam  After verbal consent patient was treated with HVLA, ME, FPR techniques in cervical, thoracic rib, lumbar and sacral areas  Patient tolerated the procedure well with improvement in symptoms  Patient given exercises, stretches and lifestyle modifications  See medications in patient instructions if given  Patient will follow up in 4 weeks

## 2019-02-13 NOTE — Patient Instructions (Addendum)
Good to see you  Ice is your friend Ice 20 minutes 2 times daily. Usually after activity and before bed. PT will be calling you  Once weekly vitamin D for 12 weeks Over the counter  Iron 65mg  with 500mg  of vitamin C daily  I tried some manipulation  See me again in 3-4 weeks

## 2019-04-07 ENCOUNTER — Other Ambulatory Visit: Payer: Self-pay | Admitting: Obstetrics & Gynecology

## 2019-04-07 MED ORDER — METRONIDAZOLE 0.75 % VA GEL: 1.0000 | Freq: Every day | VAGINAL | 1 refills | Status: DC

## 2019-04-22 ENCOUNTER — Ambulatory Visit (INDEPENDENT_AMBULATORY_CARE_PROVIDER_SITE_OTHER): Payer: BLUE CROSS/BLUE SHIELD | Admitting: Primary Care

## 2019-04-22 ENCOUNTER — Encounter: Payer: Self-pay | Admitting: Primary Care

## 2019-04-22 VITALS — Wt 155.0 lb

## 2019-04-22 DIAGNOSIS — F411 Generalized anxiety disorder: Secondary | ICD-10-CM | POA: Insufficient documentation

## 2019-04-22 MED ORDER — SERTRALINE HCL 25 MG PO TABS: 25.0000 mg | ORAL_TABLET | Freq: Every day | ORAL | 1 refills | Status: DC

## 2019-04-22 NOTE — Assessment & Plan Note (Signed)
Chronic for years, overall able to manage on her own until just now. GAD 7 score of 12 today. Denies SI/HI.  Discussed different options for treatment including medication vs therapy, she opts for medication. Rx for sertraline 25 mg sent to pharmacy.  Patient is to take 1/2 tablet daily for 6 days, then advance to 1 full tablet thereafter. We discussed possible side effects of headache, GI upset, drowsiness, and SI/HI. If thoughts of SI/HI develop, we discussed to present to the emergency immediately. Patient verbalized understanding.   Follow up in 6 weeks for re-evaluation.

## 2019-04-22 NOTE — Patient Instructions (Signed)
Start sertraline (Zoloft) 25 mg daily for anxiety. Start by taking 1/2 tablet by mouth daily for 6 days, then increase to 1 full tablet thereafter.  Schedule a follow up visit for 6 weeks as discussed.  It was a pleasure to see you today! Allie Bossier, NP-C

## 2019-04-22 NOTE — Telephone Encounter (Signed)
I have reply to patient to contact our office.

## 2019-04-22 NOTE — Progress Notes (Signed)
Subjective:    Patient ID: Sharon Jones, female    DOB: [birth year], 43 y.o.   MRN: [id number]  HPI  Virtual Visit via Video Note  I connected with Sharon Jones on 04/22/19 at 12:00 PM EDT by a video enabled telemedicine application and verified that I am speaking with the correct person using two identifiers.  Location: Patient: Home Provider: Office   I discussed the limitations of evaluation and management by telemedicine and the availability of in person appointments. The patient expressed understanding and agreed to proceed.  History of Present Illness:   [name] is a 43 year old female who presents today with a chief complaint of anxiety.  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 Symptoms include feeling anxious, feeling tearful, daily worry, easily irritable. She has a long history of anxiety that has been intermittent for years. Over the last several months symptoms have increased. GAD 7 score of 12 today. She's never been treated for her anxiety in the past, but is ready for treatment now.    Observations/Objective:  Alert and oriented. Appears well, not sickly. No distress. Speaking in complete sentences.   Assessment and Plan:  See problem based charting.  Follow Up Instructions:  Start sertraline (Zoloft) 25 mg daily for anxiety. Start by taking 1/2 tablet by mouth daily for 6 days, then increase to 1 full tablet thereafter.  Schedule a follow up visit for 6 weeks as discussed.  It was a pleasure to see you today! Allie Bossier, NP-C    I discussed the assessment and treatment plan with the patient. The patient was provided an opportunity to ask questions and all were answered. The patient agreed with the plan and demonstrated an understanding of the instructions.   The patient was advised to call back or seek an in-person evaluation if the symptoms worsen  or if the condition fails to improve as anticipated.     Pleas Koch, NP   Review of Systems  Respiratory: Negative for shortness of breath.   Cardiovascular: Negative for chest pain.  Psychiatric/Behavioral: The patient is nervous/anxious.        See HPI       Past Medical History:  Diagnosis Date  . Abnormal finding on Pap smear, ASCUS 1997  . Anxiety    doesn't require meds  . Chronic neck pain    bulding disc  . Dizziness   . Graves disease   . H/O echocardiogram    pt. consulted /w Dr. Acie Fredrickson, 2 + yrs. ago, had ECHO due to Graves disease.   Marland Kitchen Headache    otc med prn  . Hypertension    doesn't require meds  . Hypertrophy, vulva 10/08/2014  . PONV (postoperative nausea and vomiting)   . Rash    arms and feet  . SVD (spontaneous vaginal delivery)    x 1     Social History   Socioeconomic History  . Marital status: Married    Spouse name: Not on file  . Number of children: Not on file  . Years of education: Not on file  . Highest education level: Not on file  Occupational History  . Not on file  Social Needs  . Financial resource strain: Not on file  . Food insecurity:    Worry: Not on file    Inability: Not on file  . Transportation needs:    Medical:  Not on file    Non-medical: Not on file  Tobacco Use  . Smoking status: Former Smoker    Types: Cigarettes    Last attempt to quit: 12/04/2010    Years since quitting: 8.3  . Smokeless tobacco: Never Used  . Tobacco comment: quit in mar 2013-uses EC cigarette  Substance and Sexual Activity  . Alcohol use: Yes    Comment: 2-3 glasses of wine daily  . Drug use: No  . Sexual activity: Yes    Birth control/protection: I.U.D.  Lifestyle  . Physical activity:    Days per week: Not on file    Minutes per session: Not on file  . Stress: Not on file  Relationships  . Social connections:    Talks on phone: Not on file    Gets together: Not on file    Attends religious service: Not on file     Active member of club or organization: Not on file    Attends meetings of clubs or organizations: Not on file    Relationship status: Not on file  . Intimate partner violence:    Fear of current or ex partner: Not on file    Emotionally abused: Not on file    Physically abused: Not on file    Forced sexual activity: Not on file  Other Topics Concern  . Not on file  Social History Narrative   Married.   1 child.   Works at [employer].   Enjoys spending time with her family.    Past Surgical History:  Procedure Laterality Date  . ANTERIOR CERVICAL DECOMP/DISCECTOMY FUSION  10/02/2012   Procedure: ANTERIOR CERVICAL DECOMPRESSION/DISCECTOMY FUSION 1 LEVEL;  Surgeon: Sinclair Ship, MD;  Location: [facility];  Service: Orthopedics;  Laterality: Bilateral;  Anterior cervical decompression fusion, 5-6 with instrumentation, allograft.  Marland Kitchen BREAST ENHANCEMENT SURGERY    . CERVICAL CONE BIOPSY     twice  . LABIOPLASTY N/A 10/09/2014   Procedure: LABIAPLASTY;  Surgeon: Janyth Contes, MD;  Location: [facility];  Service: Gynecology;  Laterality: N/A;    Family History  Problem Relation Age of Onset  . Hypertension Mother   . Alcohol abuse Father   . Alcohol abuse Maternal Uncle   . Alcohol abuse Paternal Aunt   . Arthritis Maternal Grandmother   . Alcohol abuse Maternal Grandfather   . Arthritis Maternal Grandfather   . Arthritis Paternal Grandmother   . Arthritis Paternal Grandfather     No Known Allergies  Current Outpatient Medications on File Prior to Visit  Medication Sig Dispense Refill  . cetirizine (ZYRTEC) 10 MG tablet Take 10 mg by mouth daily.    Marland Kitchen levothyroxine (SYNTHROID, LEVOTHROID) 25 MCG tablet Take 1 tablet by mouth every morning on an empty stomach with water. No food or other medications for 30 minutes. 90 tablet 0  . lisinopril-hydrochlorothiazide (PRINZIDE,ZESTORETIC) 20-25 MG tablet Take 1 tablet by mouth once daily for blood pressure. 90 tablet 3  . Vitamin D,  Ergocalciferol, (DRISDOL) 1.25 MG (50000 UT) CAPS capsule Take 1 capsule (50,000 Units total) by mouth every 7 (seven) days. 12 capsule 0   No current facility-administered medications on file prior to visit.     Wt 155 lb (70.3 kg)   BMI 25.02 kg/m    Objective:   Physical Exam  Constitutional: She is oriented to person, place, and time. She appears well-nourished.  Respiratory: Effort normal.  Neurological: She is oriented to person, place, and time.  Psychiatric: She has a  normal mood and affect.           Assessment & Plan:

## 2019-06-20 DIAGNOSIS — Z1231 Encounter for screening mammogram for malignant neoplasm of breast: Secondary | ICD-10-CM | POA: Diagnosis not present

## 2019-06-20 DIAGNOSIS — Z6825 Body mass index (BMI) 25.0-25.9, adult: Secondary | ICD-10-CM | POA: Diagnosis not present

## 2019-06-20 DIAGNOSIS — Z1389 Encounter for screening for other disorder: Secondary | ICD-10-CM | POA: Diagnosis not present

## 2019-06-20 DIAGNOSIS — Z130 Encounter for screening for diseases of the blood and blood-forming organs and certain disorders involving the immune mechanism: Secondary | ICD-10-CM | POA: Diagnosis not present

## 2019-06-20 DIAGNOSIS — Z01419 Encounter for gynecological examination (general) (routine) without abnormal findings: Secondary | ICD-10-CM | POA: Diagnosis not present

## 2019-06-24 ENCOUNTER — Other Ambulatory Visit: Payer: Self-pay | Admitting: Obstetrics and Gynecology

## 2019-06-24 DIAGNOSIS — N6310 Unspecified lump in the right breast, unspecified quadrant: Secondary | ICD-10-CM

## 2019-07-01 ENCOUNTER — Ambulatory Visit
Admission: RE | Admit: 2019-07-01 | Discharge: 2019-07-01 | Disposition: A | Discharge status: Home / Self Care | Payer: BC Managed Care – PPO | Source: Ambulatory Visit | Attending: Obstetrics and Gynecology | Admitting: Obstetrics and Gynecology

## 2019-07-01 ENCOUNTER — Other Ambulatory Visit: Payer: Self-pay | Admitting: Obstetrics and Gynecology

## 2019-07-01 ENCOUNTER — Other Ambulatory Visit: Payer: Self-pay

## 2019-07-01 ENCOUNTER — Ambulatory Visit
Admission: RE | Admit: 2019-07-01 | Discharge: 2019-07-01 | Disposition: A | Discharge status: Home / Self Care | Payer: BLUE CROSS/BLUE SHIELD | Source: Ambulatory Visit | Attending: Obstetrics and Gynecology | Admitting: Obstetrics and Gynecology

## 2019-07-01 DIAGNOSIS — N6310 Unspecified lump in the right breast, unspecified quadrant: Secondary | ICD-10-CM

## 2019-07-01 DIAGNOSIS — N6311 Unspecified lump in the right breast, upper outer quadrant: Secondary | ICD-10-CM | POA: Diagnosis not present

## 2019-07-01 DIAGNOSIS — R922 Inconclusive mammogram: Secondary | ICD-10-CM | POA: Diagnosis not present

## 2019-07-11 ENCOUNTER — Ambulatory Visit (INDEPENDENT_AMBULATORY_CARE_PROVIDER_SITE_OTHER): Payer: BC Managed Care – PPO | Admitting: Physician Assistant

## 2019-07-11 ENCOUNTER — Encounter: Payer: Self-pay | Admitting: Physician Assistant

## 2019-07-11 ENCOUNTER — Other Ambulatory Visit: Payer: Self-pay

## 2019-07-11 VITALS — BP 135/96 | HR 67 | Wt 157.0 lb

## 2019-07-11 DIAGNOSIS — M62838 Other muscle spasm: Secondary | ICD-10-CM | POA: Insufficient documentation

## 2019-07-11 DIAGNOSIS — G43009 Migraine without aura, not intractable, without status migrainosus: Secondary | ICD-10-CM

## 2019-07-11 MED ORDER — EMGALITY 120 MG/ML ~~LOC~~ SOAJ: SUBCUTANEOUS | 11 refills | Status: DC

## 2019-07-11 MED ORDER — CYCLOBENZAPRINE HCL 10 MG PO TABS: 10.0000 mg | ORAL_TABLET | Freq: Three times a day (TID) | ORAL | 2 refills | Status: DC | PRN

## 2019-07-11 NOTE — Progress Notes (Signed)
History:  Sharon Jones is a 43 y.o. [phone] who presents to clinic today for new eval of headache.  They started over 20 years ago.  Her mom and siblings all have migraine.  They get to be severe, lasting up to 48 hours.  The pain is located in the bilat temporal area with throbbing.  Worse with movement, lights and noises.  There is nausea but no vomitting.  No warning or aura.   Worse with alcohol, blood pressure, stress, weather, some caffeine helps and some hurts.   Benadryl, ibuprofen helpful.   HIT6:68 Number of days in the last 4 weeks with:  Severe headache: 1  Moderate headache: 14 Mild headache: 0  No headache: 13   Past Medical History:  Diagnosis Date  . Abnormal finding on Pap smear, ASCUS 1997  . Anxiety    doesn't require meds  . Chronic neck pain    bulding disc  . Dizziness   . Graves disease   . H/O echocardiogram    pt. consulted /w Dr. Acie Fredrickson, 2 + yrs. ago, had ECHO due to Graves disease.   Marland Kitchen Headache    otc med prn  . Hypertension    doesn't require meds  . Hypertrophy, vulva 10/08/2014  . PONV (postoperative nausea and vomiting)   . Rash    arms and feet  . SVD (spontaneous vaginal delivery)    x 1    Social History   Socioeconomic History  . Marital status: Married    Spouse name: Not on file  . Number of children: Not on file  . Years of education: Not on file  . Highest education level: Not on file  Occupational History  . Not on file  Social Needs  . Financial resource strain: Not on file  . Food insecurity    Worry: Not on file    Inability: Not on file  . Transportation needs    Medical: Not on file    Non-medical: Not on file  Tobacco Use  . Smoking status: Former Smoker    Types: Cigarettes    Quit date: 12/04/2010    Years since quitting: 8.6  . Smokeless tobacco: Never Used  . Tobacco comment: quit in mar 2013-uses EC cigarette  Substance and Sexual Activity  . Alcohol use: Not Currently  . Drug use: No  . Sexual  activity: Yes    Birth control/protection: I.U.D.  Lifestyle  . Physical activity    Days per week: Not on file    Minutes per session: Not on file  . Stress: Not on file  Relationships  . Social Herbalist on phone: Not on file    Gets together: Not on file    Attends religious service: Not on file    Active member of club or organization: Not on file    Attends meetings of clubs or organizations: Not on file    Relationship status: Not on file  . Intimate partner violence    Fear of current or ex partner: Not on file    Emotionally abused: Not on file    Physically abused: Not on file    Forced sexual activity: Not on file  Other Topics Concern  . Not on file  Social History Narrative   Married.   1 child.   Works at [employer].   Enjoys spending time with her family.    Family History  Problem Relation Age of Onset  . Hypertension Mother   .  Alcohol abuse Father   . Alcohol abuse Maternal Uncle   . Alcohol abuse Paternal Aunt   . Arthritis Maternal Grandmother   . Alcohol abuse Maternal Grandfather   . Arthritis Maternal Grandfather   . Arthritis Paternal Grandmother   . Arthritis Paternal Grandfather     No Known Allergies  Current Outpatient Medications on File Prior to Visit  Medication Sig Dispense Refill  . cetirizine (ZYRTEC) 10 MG tablet Take 10 mg by mouth daily.    Marland Kitchen levothyroxine (SYNTHROID, LEVOTHROID) 25 MCG tablet Take 1 tablet by mouth every morning on an empty stomach with water. No food or other medications for 30 minutes. (Patient not taking: Reported on 07/11/2019) 90 tablet 0  . lisinopril-hydrochlorothiazide (PRINZIDE,ZESTORETIC) 20-25 MG tablet Take 1 tablet by mouth once daily for blood pressure. (Patient not taking: Reported on 07/11/2019) 90 tablet 3  . sertraline (ZOLOFT) 25 MG tablet Take 1 tablet (25 mg total) by mouth daily. For anxiety. (Patient not taking: Reported on 07/11/2019) 30 tablet 1  . Vitamin D, Ergocalciferol, (DRISDOL) 1.25  MG (50000 UT) CAPS capsule Take 1 capsule (50,000 Units total) by mouth every 7 (seven) days. (Patient not taking: Reported on 07/11/2019) 12 capsule 0   No current facility-administered medications on file prior to visit.      Review of Systems:  All pertinent positive/negative included in HPI, all other review of systems are negative   Objective:  Physical Exam BP (!) 135/96   Pulse 67   Wt 157 lb (71.2 kg)   BMI 25.34 kg/m  CONSTITUTIONAL: Well-developed, well-nourished female in no acute distress.  EYES: EOM intact CARDIOVASCULAR: Regular rate  RESPIRATORY: Normal rate.  MUSCULOSKELETAL: Normal ROM SKIN: Warm, dry without erythema  NEUROLOGICAL: Alert, oriented, CN II-XII grossly intact, Appropriate balance PSYCH: Normal behavior, mood   Assessment & Plan:  Assessment: 1. Migraine without aura and without status migrainosus, not intractable   2. Muscle spasm    New diagnoses  Plan: Will begin Emgality and use monthly for prevention.  2 injections first month and one each month thereafter.  Nurtec sample provided for acute use.   Flexeril for HA/muscle spasm - sedation precautions discussed  Follow-up in 12 months or sooner PRN  Paticia Stack, PA-C 07/11/2019 11:40 AM

## 2019-07-11 NOTE — Patient Instructions (Signed)
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
## 2019-12-30 ENCOUNTER — Other Ambulatory Visit: Payer: Self-pay | Admitting: Obstetrics & Gynecology

## 2019-12-30 DIAGNOSIS — Z0000 Encounter for general adult medical examination without abnormal findings: Secondary | ICD-10-CM

## 2019-12-31 ENCOUNTER — Other Ambulatory Visit: Payer: BC Managed Care – PPO

## 2019-12-31 ENCOUNTER — Other Ambulatory Visit: Payer: Self-pay

## 2019-12-31 DIAGNOSIS — E559 Vitamin D deficiency, unspecified: Secondary | ICD-10-CM | POA: Diagnosis not present

## 2019-12-31 DIAGNOSIS — Z0000 Encounter for general adult medical examination without abnormal findings: Secondary | ICD-10-CM | POA: Diagnosis not present

## 2020-01-01 LAB — VITAMIN D 25 HYDROXY (VIT D DEFICIENCY, FRACTURES): Vit D, 25-Hydroxy: 23.7 ng/mL — ABNORMAL LOW (ref 30.0–100.0)

## 2020-01-09 DIAGNOSIS — D2261 Melanocytic nevi of right upper limb, including shoulder: Secondary | ICD-10-CM | POA: Diagnosis not present

## 2020-01-09 DIAGNOSIS — D2262 Melanocytic nevi of left upper limb, including shoulder: Secondary | ICD-10-CM | POA: Diagnosis not present

## 2020-01-09 DIAGNOSIS — D485 Neoplasm of uncertain behavior of skin: Secondary | ICD-10-CM | POA: Diagnosis not present

## 2020-01-09 DIAGNOSIS — D2272 Melanocytic nevi of left lower limb, including hip: Secondary | ICD-10-CM | POA: Diagnosis not present

## 2020-01-09 DIAGNOSIS — L570 Actinic keratosis: Secondary | ICD-10-CM | POA: Diagnosis not present

## 2020-01-09 DIAGNOSIS — D225 Melanocytic nevi of trunk: Secondary | ICD-10-CM | POA: Diagnosis not present

## 2020-02-18 ENCOUNTER — Telehealth: Payer: Self-pay | Admitting: Physician Assistant

## 2020-02-18 MED ORDER — CYCLOBENZAPRINE HCL 10 MG PO TABS: 10.0000 mg | ORAL_TABLET | Freq: Three times a day (TID) | ORAL | 2 refills | Status: DC | PRN

## 2020-02-18 NOTE — Telephone Encounter (Signed)
Pt requests refill of flexeril.   Done.  KTC

## 2020-02-23 ENCOUNTER — Other Ambulatory Visit: Payer: Self-pay | Admitting: *Deleted

## 2020-02-23 MED ORDER — CYCLOBENZAPRINE HCL 10 MG PO TABS: 10.0000 mg | ORAL_TABLET | Freq: Three times a day (TID) | ORAL | 2 refills | Status: DC | PRN

## 2020-02-23 NOTE — Progress Notes (Signed)
Pt wanted meds sent into [facility] instead of [facility]

## 2020-04-07 ENCOUNTER — Telehealth: Payer: Self-pay | Admitting: Family Medicine

## 2020-04-07 NOTE — Telephone Encounter (Signed)
Patient called asking about what HTN medication she is taking and the doses. Reviewed medication list and doses with patient.

## 2020-05-28 ENCOUNTER — Other Ambulatory Visit: Payer: Self-pay | Admitting: Physician Assistant

## 2020-05-28 MED ORDER — BACLOFEN 10 MG PO TABS: 10.0000 mg | ORAL_TABLET | Freq: Three times a day (TID) | ORAL | 1 refills | Status: DC

## 2020-05-28 NOTE — Progress Notes (Unsigned)
Pt states flexeril has caused constipation and she would like to trial a different muscle relaxant.  Will try baclofen.  Pt reminded to drink water as she is not good at hydrating.

## 2020-06-18 ENCOUNTER — Other Ambulatory Visit: Payer: Self-pay | Admitting: Primary Care

## 2020-06-18 DIAGNOSIS — I10 Essential (primary) hypertension: Secondary | ICD-10-CM

## 2020-06-18 DIAGNOSIS — Z114 Encounter for screening for human immunodeficiency virus [HIV]: Secondary | ICD-10-CM

## 2020-06-18 DIAGNOSIS — E039 Hypothyroidism, unspecified: Secondary | ICD-10-CM

## 2020-06-18 DIAGNOSIS — Z1159 Encounter for screening for other viral diseases: Secondary | ICD-10-CM

## 2020-07-15 ENCOUNTER — Other Ambulatory Visit: Payer: Self-pay | Admitting: Physician Assistant

## 2020-07-16 ENCOUNTER — Other Ambulatory Visit: Payer: BC Managed Care – PPO

## 2020-07-21 ENCOUNTER — Ambulatory Visit
Admission: EM | Admit: 2020-07-21 | Discharge: 2020-07-21 | Disposition: A | Discharge status: Home / Self Care | Payer: BC Managed Care – PPO | Attending: Emergency Medicine | Admitting: Emergency Medicine

## 2020-07-21 ENCOUNTER — Telehealth: Payer: Self-pay | Admitting: Primary Care

## 2020-07-21 DIAGNOSIS — R21 Rash and other nonspecific skin eruption: Secondary | ICD-10-CM | POA: Diagnosis not present

## 2020-07-21 DIAGNOSIS — R220 Localized swelling, mass and lump, head: Secondary | ICD-10-CM

## 2020-07-21 MED ORDER — PREDNISONE 10 MG (21) PO TBPK
ORAL_TABLET | Freq: Every day | ORAL | 0 refills | Status: DC
Start: 2020-07-22 — End: 2020-09-17

## 2020-07-21 MED ORDER — METHYLPREDNISOLONE SODIUM SUCC 125 MG IJ SOLR
125.0000 mg | Freq: Once | INTRAMUSCULAR | Status: AC
  Administered 2020-07-21: 125 mg via INTRAMUSCULAR

## 2020-07-21 NOTE — ED Triage Notes (Signed)
Patient reports she has recently been getting over [place]. Reports today was her first day back to work. Patient reports at 830 she broke out in a rash that is covering her entire trunk and all extremities. Denies pain, endorses itching. Patient reports her upper lip is also swollen. Patient took 50mg  diphenhydramine at 0830

## 2020-07-21 NOTE — ED Provider Notes (Signed)
Roderic Palau    CSN: [id number] Arrival date & time: 07/21/20  1256      History   Chief Complaint Chief Complaint  Patient presents with   Rash   Oral Swelling    HPI Sharon Jones is a 44 y.o. female.   Patient presents with rash on her trunk and extremities x today.  Her lip is not swollen also.  She denies difficulty swallowing or breathing.  Patient states the rash is pruritic but nontender.  She denies fever, chills, chest pain, shortness of breath, abdominal pain, or other symptoms.  Patient took 50 mg of Benadryl and came here for evaluation.  Today is her 1st day back from work after recovering from [place].  She states she has been bedridden for 2 weeks with COVID.  The history is provided by the patient.    Past Medical History:  Diagnosis Date   Abnormal finding on Pap smear, ASCUS 1997   Anxiety    doesn't require meds   Chronic neck pain    bulding disc   Dizziness    Graves disease    H/O echocardiogram    pt. consulted /w Dr. Acie Fredrickson, 2 + yrs. ago, had ECHO due to Graves disease.    Headache    otc med prn   Hypertension    doesn't require meds   Hypertrophy, vulva 10/08/2014   PONV (postoperative nausea and vomiting)    Rash    arms and feet   SVD (spontaneous vaginal delivery)    x 1    Patient Active Problem List   Diagnosis Date Noted   Migraine without aura and without status migrainosus, not intractable 07/11/2019   Muscle spasm 07/11/2019   GAD (generalized anxiety disorder) 04/22/2019   Cervicogenic headache 02/13/2019   Nonallopathic lesion of cervical region 02/13/2019   Nonallopathic lesion of thoracic region 02/13/2019   Nonallopathic lesion of rib cage 02/13/2019   Nonallopathic lesion of lumbosacral region 02/13/2019   Nonallopathic lesion of sacral region 02/13/2019   Essential hypertension 03/19/2017   Hypothyroidism 03/19/2017   Hypertrophy, vulva 10/08/2014   H/O chlamydia infection  03/09/1999    Past Surgical History:  Procedure Laterality Date   ANTERIOR CERVICAL DECOMP/DISCECTOMY FUSION  10/02/2012   Procedure: ANTERIOR CERVICAL DECOMPRESSION/DISCECTOMY FUSION 1 LEVEL;  Surgeon: Sinclair Ship, MD;  Location: [facility];  Service: Orthopedics;  Laterality: Bilateral;  Anterior cervical decompression fusion, 5-6 with instrumentation, allograft.   BREAST ENHANCEMENT SURGERY     CERVICAL CONE BIOPSY     twice   LABIOPLASTY N/A 10/09/2014   Procedure: LABIAPLASTY;  Surgeon: Janyth Contes, MD;  Location: [facility];  Service: Gynecology;  Laterality: N/A;    OB History    Gravida  3   Para  1   Term  1   Preterm  0   AB  2   Living  1     SAB      TAB      Ectopic      Multiple      Live Births               Home Medications    Prior to Admission medications   Medication Sig Start Date End Date Taking? Authorizing Provider  baclofen (LIORESAL) 10 MG tablet TAKE 1 TABLET BY MOUTH THREE TIMES A DAY 07/16/20   Jaclyn Prime, Collene Leyden, PA-C  cetirizine (ZYRTEC) 10 MG tablet Take 10 mg by mouth daily.    [provider]  cyclobenzaprine (FLEXERIL) 10 MG tablet Take 1 tablet (10 mg total) by mouth 3 (three) times daily as needed for muscle spasms (headache). 02/23/20   Teague Carlis Abbott, Collene Leyden, PA-C  Galcanezumab-gnlm (EMGALITY) 120 MG/ML SOAJ Inject 240 mg into the skin as directed AND 120 mg every 30 (thirty) days. Inj 240mg  once then 120mg  monthly. 07/11/19   Jaclyn Prime, Collene Leyden, PA-C  levothyroxine (SYNTHROID, LEVOTHROID) 25 MCG tablet Take 1 tablet by mouth every morning on an empty stomach with water. No food or other medications for 30 minutes. Patient not taking: Reported on 07/11/2019 08/30/18   Pleas Koch, NP  lisinopril-hydrochlorothiazide (PRINZIDE,ZESTORETIC) 20-25 MG tablet Take 1 tablet by mouth once daily for blood pressure. Patient not taking: Reported on 07/11/2019 08/30/18   Pleas Koch, NP  predniSONE  (STERAPRED UNI-PAK 21 TAB) 10 MG (21) TBPK tablet Take by mouth daily. As directed 07/22/20   Sharion Balloon, NP  sertraline (ZOLOFT) 25 MG tablet Take 1 tablet (25 mg total) by mouth daily. For anxiety. Patient not taking: Reported on 07/11/2019 04/22/19   Pleas Koch, NP  Vitamin D, Ergocalciferol, (DRISDOL) 1.25 MG (50000 UT) CAPS capsule Take 1 capsule (50,000 Units total) by mouth every 7 (seven) days. Patient not taking: Reported on 07/11/2019 02/13/19   Lyndal Pulley, DO    Family History Family History  Problem Relation Age of Onset   Hypertension Mother    Alcohol abuse Father    Alcohol abuse Maternal Uncle    Alcohol abuse Paternal Aunt    Arthritis Maternal Grandmother    Alcohol abuse Maternal Grandfather    Arthritis Maternal Grandfather    Arthritis Paternal Grandmother    Arthritis Paternal Grandfather     Social History Social History   Tobacco Use   Smoking status: Former Smoker    Types: Cigarettes    Quit date: 12/04/2010    Years since quitting: 9.6   Smokeless tobacco: Never Used   Tobacco comment: quit in mar 2013-uses EC cigarette  Substance Use Topics   Alcohol use: Not Currently   Drug use: No     Allergies   Patient has no known allergies.   Review of Systems Review of Systems  Constitutional: Negative for chills and fever.  HENT: Positive for facial swelling. Negative for ear pain and sore throat.   Eyes: Negative for pain and visual disturbance.  Respiratory: Negative for cough, shortness of breath, wheezing and stridor.   Cardiovascular: Negative for chest pain and palpitations.  Gastrointestinal: Negative for abdominal pain and vomiting.  Genitourinary: Negative for dysuria and hematuria.  Musculoskeletal: Negative for arthralgias and back pain.  Skin: Positive for rash. Negative for color change.  Neurological: Negative for seizures and syncope.  All other systems reviewed and are negative.    Physical Exam Triage  Vital Signs ED Triage Vitals  Enc Vitals Group     BP 07/21/20 1259 125/87     Pulse Rate 07/21/20 1259 100     Resp 07/21/20 1259 16     Temp 07/21/20 1259 98.1 F (36.7 C)     Temp src --      SpO2 07/21/20 1259 96 %     Weight --      Height --      Head Circumference --      Peak Flow --      Pain Score 07/21/20 1257 0     Pain Loc --  Pain Edu? --      Excl. in [place]? --    No data found.  Updated Vital Signs BP 125/87    Pulse 100    Temp 98.1 F (36.7 C)    Resp 16    SpO2 96%   Visual Acuity Right Eye Distance:   Left Eye Distance:   Bilateral Distance:    Right Eye Near:   Left Eye Near:    Bilateral Near:     Physical Exam Vitals and nursing note reviewed.  Constitutional:      General: She is not in acute distress.    Appearance: She is well-developed. She is not ill-appearing.  HENT:     Head: Normocephalic and atraumatic.     Mouth/Throat:     Mouth: Mucous membranes are moist.     Pharynx: Oropharynx is clear.     Comments: Upper lip swollen.  Speech clear. No difficulty swallowing. No tongue or oropharyngeal swelling.   Eyes:     Conjunctiva/sclera: Conjunctivae normal.  Cardiovascular:     Rate and Rhythm: Normal rate and regular rhythm.     Heart sounds: No murmur heard.   Pulmonary:     Effort: Pulmonary effort is normal. No respiratory distress.     Breath sounds: Normal breath sounds. No stridor. No wheezing, rhonchi or rales.  Abdominal:     Palpations: Abdomen is soft.     Tenderness: There is no abdominal tenderness.  Musculoskeletal:     Cervical back: Neck supple.     Right lower leg: No edema.     Left lower leg: No edema.  Skin:    General: Skin is warm and dry.     Findings: Rash present.     Comments: Raised red rash on extremities.  See pictures for details.    Neurological:     General: No focal deficit present.     Mental Status: She is alert and oriented to person, place, and time.     Gait: Gait normal.    Psychiatric:        Mood and Affect: Mood normal.        Behavior: Behavior normal.            UC Treatments / Results  Labs (all labs ordered are listed, but only abnormal results are displayed) Labs Reviewed - No data to display  EKG   Radiology No results found.  Procedures Procedures (including critical care time)  Medications Ordered in UC Medications  methylPREDNISolone sodium succinate (SOLU-MEDROL) 125 mg/2 mL injection 125 mg (125 mg Intramuscular Given 07/21/20 1314)    Initial Impression / Assessment and Plan / UC Course  I have reviewed the triage vital signs and the nursing notes.  Pertinent labs & imaging results that were available during my care of the patient were reviewed by me and considered in my medical decision making (see chart for details).   Swelling of upper lip.  Rash.  Given Solu-Medrol here.  Start prednisone tomorrow.  Instructed patient to take Benadryl every 6 hours.  Strict instructions for calling 911 and going to the emergency department if her symptoms worsen.  Instructed her to follow-up with her PCP tomorrow for recheck.  Patient agrees to plan of care.   Final Clinical Impressions(s) / UC Diagnoses   Final diagnoses:  Swelling of upper lip  Rash     Discharge Instructions     You were given an injection of Solu-Medrol here.  Start the prednisone  tomorrow.    Take Benadryl as directed.    Call 911 or go to the emergency department if you have swelling of your tongue or throat, difficulty swallowing, difficulty breathing, or other concerning symptoms.    Follow up with your primary care provider tomorrow for a recheck.        ED Prescriptions    Medication Sig Dispense Auth. Provider   predniSONE (STERAPRED UNI-PAK 21 TAB) 10 MG (21) TBPK tablet Take by mouth daily. As directed 21 tablet Sharion Balloon, NP     PDMP not reviewed this encounter.   Sharion Balloon, NP 07/21/20 1324

## 2020-07-21 NOTE — Telephone Encounter (Signed)
I"m sorry to hear that she has Covid. If the rash is itchy then she can try topical cortisone cream. If that doesn't help then have her take her Zyrtec 10 mg daily and also Pepcid 20 mg once daily. Off brand is fine. Stop benadryl.

## 2020-07-21 NOTE — Discharge Instructions (Addendum)
You were given an injection of Solu-Medrol here.  Start the prednisone tomorrow.    Take Benadryl as directed.    Call 911 or go to the emergency department if you have swelling of your tongue or throat, difficulty swallowing, difficulty breathing, or other concerning symptoms.    Follow up with your primary care provider tomorrow for a recheck.

## 2020-07-21 NOTE — Telephone Encounter (Signed)
Pt said she is getting over covid but now has a huge rash in a flower shape. She had a physical scheduled for Friday 8/20 but I changed it due to her just getting over covid. Pt tried sending picture to Anda Kraft of the rash but myChart said the file was too large. Pt wants to know if there is anything else she can do besides taking Benadryl?

## 2020-07-22 NOTE — Telephone Encounter (Signed)
It's okay to continue Benadryl, I just didn't want her taking both Benadryl and Zyrtec. If she would like to wait until next week then I am happy to excuse her for 08/20. Please provide work note if she prefers.

## 2020-07-22 NOTE — Telephone Encounter (Signed)
Patient called office. Patient wanted to inform [facility] K[facility] that patient ended up going to urgent care yesterday due to lip swelling.  Notified patient of Kate's comments. Verbalized understanding.  However, patient was insisting on continuing taking Benadryl. I repeated Kate's comments for patient again.  Patient stated that urgent care gave her a work note until 07/23/2020, patient wanted to know if Anda Kraft recommended her going back on 07/23/2020 or should she wait until next week.  Patient request reply back through [place]

## 2020-07-23 ENCOUNTER — Encounter: Payer: BC Managed Care – PPO | Admitting: Primary Care

## 2020-07-30 ENCOUNTER — Other Ambulatory Visit: Payer: Self-pay

## 2020-07-30 ENCOUNTER — Ambulatory Visit (INDEPENDENT_AMBULATORY_CARE_PROVIDER_SITE_OTHER): Payer: BC Managed Care – PPO | Admitting: Physician Assistant

## 2020-07-30 ENCOUNTER — Encounter: Payer: Self-pay | Admitting: Physician Assistant

## 2020-07-30 VITALS — BP 127/83 | HR 84

## 2020-07-30 DIAGNOSIS — T7840XA Allergy, unspecified, initial encounter: Secondary | ICD-10-CM | POA: Diagnosis not present

## 2020-07-30 DIAGNOSIS — G43009 Migraine without aura, not intractable, without status migrainosus: Secondary | ICD-10-CM

## 2020-07-30 MED ORDER — PROMETHAZINE HCL 25 MG PO TABS
25.0000 mg | ORAL_TABLET | Freq: Four times a day (QID) | ORAL | 0 refills | Status: DC | PRN
Start: 2020-07-30 — End: 2023-05-11

## 2020-07-30 MED ORDER — NURTEC 75 MG PO TBDP: 75.0000 mg | ORAL_TABLET | ORAL | 11 refills | Status: DC

## 2020-07-30 NOTE — Patient Instructions (Signed)

## 2020-07-30 NOTE — Progress Notes (Signed)
History:  Sharon Jones is a 44 y.o. [phone] who presents to clinic today for headache followup.  She has used Emgality samples for some time with good results but felt it was not lasting the full month.  On Auguest 11, she used Ajovy in the place of emgality (injection into left lower abdomen).  Exactly one week later, she began having a systemic allergic reaction with hives all over her body, lip swelling, chest tightness and SOB.  She had concomitant COVID, testing positive on August 9th. She was given a injection of solu-medrol on 8/18, followed by a steroid dose pack, pepcid, zyrtec, benadryl. She continues to have extensive itching and hives and rashes that are red but flat.  She is using zyrtec, pepcid and benadryl daily.   No further medications used since the onset of this reaction. She has not had any headaches since this started (likely due to extensive steroids). Prior to [employer] - she had 2-3 significant headaches weekly.  Initially with taking emgality, she would notice a week of headaches the last week of each month.   HIT6: Number of days in the last 4 weeks with:  Severe headache: 7 Moderate headache: 0 Mild headache: 0  No headache:21   Past Medical History:  Diagnosis Date  . Abnormal finding on Pap smear, ASCUS 1997  . Anxiety    doesn't require meds  . Chronic neck pain    bulding disc  . Dizziness   . Graves disease   . H/O echocardiogram    pt. consulted /w Dr. Acie Fredrickson, 2 + yrs. ago, had ECHO due to Graves disease.   Marland Kitchen Headache    otc med prn  . Hypertension    doesn't require meds  . Hypertrophy, vulva 10/08/2014  . PONV (postoperative nausea and vomiting)   . Rash    arms and feet  . SVD (spontaneous vaginal delivery)    x 1    Social History   Socioeconomic History  . Marital status: Married    Spouse name: Not on file  . Number of children: Not on file  . Years of education: Not on file  . Highest education level: Not on file  Occupational  History  . Not on file  Tobacco Use  . Smoking status: Former Smoker    Types: Cigarettes    Quit date: 12/04/2010    Years since quitting: 9.6  . Smokeless tobacco: Never Used  . Tobacco comment: quit in mar 2013-uses EC cigarette  Substance and Sexual Activity  . Alcohol use: Not Currently  . Drug use: No  . Sexual activity: Yes    Birth control/protection: I.U.D.  Other Topics Concern  . Not on file  Social History Narrative   Married.   1 child.   Works at [employer].   Enjoys spending time with her family.   Social Determinants of Health   Financial Resource Strain:   . Difficulty of Paying Living Expenses: Not on file  Food Insecurity:   . Worried About Charity fundraiser in the Last Year: Not on file  . Ran Out of Food in the Last Year: Not on file  Transportation Needs:   . Lack of Transportation (Medical): Not on file  . Lack of Transportation (Non-Medical): Not on file  Physical Activity:   . Days of Exercise per Week: Not on file  . Minutes of Exercise per Session: Not on file  Stress:   . Feeling of Stress : Not on file  Social Connections:   . Frequency of Communication with Friends and Family: Not on file  . Frequency of Social Gatherings with Friends and Family: Not on file  . Attends Religious Services: Not on file  . Active Member of Clubs or Organizations: Not on file  . Attends Archivist Meetings: Not on file  . Marital Status: Not on file  Intimate Partner Violence:   . Fear of Current or Ex-Partner: Not on file  . Emotionally Abused: Not on file  . Physically Abused: Not on file  . Sexually Abused: Not on file    Family History  Problem Relation Age of Onset  . Hypertension Mother   . Alcohol abuse Father   . Alcohol abuse Maternal Uncle   . Alcohol abuse Paternal Aunt   . Arthritis Maternal Grandmother   . Alcohol abuse Maternal Grandfather   . Arthritis Maternal Grandfather   . Arthritis Paternal Grandmother   . Arthritis Paternal  Grandfather     Allergies  Allergen Reactions  . Ajovy [Fremanezumab-Vfrm] Swelling and Rash    Current Outpatient Medications on File Prior to Visit  Medication Sig Dispense Refill  . baclofen (LIORESAL) 10 MG tablet TAKE 1 TABLET BY MOUTH THREE TIMES A DAY 90 tablet 1  . cetirizine (ZYRTEC) 10 MG tablet Take 10 mg by mouth daily.    . cyclobenzaprine (FLEXERIL) 10 MG tablet Take 1 tablet (10 mg total) by mouth 3 (three) times daily as needed for muscle spasms (headache). 40 tablet 2  . levothyroxine (SYNTHROID, LEVOTHROID) 25 MCG tablet Take 1 tablet by mouth every morning on an empty stomach with water. No food or other medications for 30 minutes. (Patient not taking: Reported on 07/11/2019) 90 tablet 0  . lisinopril-hydrochlorothiazide (PRINZIDE,ZESTORETIC) 20-25 MG tablet Take 1 tablet by mouth once daily for blood pressure. (Patient not taking: Reported on 07/11/2019) 90 tablet 3  . predniSONE (STERAPRED UNI-PAK 21 TAB) 10 MG (21) TBPK tablet Take by mouth daily. As directed (Patient not taking: Reported on 07/30/2020) 21 tablet 0  . sertraline (ZOLOFT) 25 MG tablet Take 1 tablet (25 mg total) by mouth daily. For anxiety. (Patient not taking: Reported on 07/11/2019) 30 tablet 1  . Vitamin D, Ergocalciferol, (DRISDOL) 1.25 MG (50000 UT) CAPS capsule Take 1 capsule (50,000 Units total) by mouth every 7 (seven) days. (Patient not taking: Reported on 07/11/2019) 12 capsule 0   No current facility-administered medications on file prior to visit.     Review of Systems:  All pertinent positive/negative included in HPI, all other review of systems are negative   Objective:  Physical Exam BP 127/83   Pulse 84  CONSTITUTIONAL: Well-developed, well-nourished female in no acute distress.  EYES: EOM intact ENT: Normocephalic CARDIOVASCULAR: Regular rate  RESPIRATORY: Normal rate.  MUSCULOSKELETAL: Normal ROM SKIN: Warm, dry without erythema  NEUROLOGICAL: Alert, oriented, CN II-XII grossly  intact, Appropriate balance PSYCH: Normal behavior, mood   Assessment & Plan:  Assessment: 1. Migraine without aura and without status migrainosus, not intractable   2. Allergic reaction to drug, initial encounter      Plan: Hold off on any new medications as long as possible.  Try to wait at least until 9/11 when it has been 30 days from [place].  If pt has breakthrough HA - use phenergan.  Sedation precautions discussed Ibuprofen may be used but wait another week due to recent steroids.   After the 30 days, pt may begin to use nurtec for prevention - every  other day.  Pt has used this medication acutely and had good response.    Call or RTC PRN Follow-up in 12 months or sooner PRN  Paticia Stack, PA-C 07/30/2020 11:22 AM

## 2020-08-13 ENCOUNTER — Encounter: Payer: Self-pay | Admitting: *Deleted

## 2020-08-13 ENCOUNTER — Other Ambulatory Visit: Payer: Self-pay | Admitting: Physician Assistant

## 2020-08-20 ENCOUNTER — Encounter: Payer: BC Managed Care – PPO | Admitting: Primary Care

## 2020-08-28 ENCOUNTER — Other Ambulatory Visit: Payer: Self-pay | Admitting: Physician Assistant

## 2020-09-17 ENCOUNTER — Other Ambulatory Visit: Payer: Self-pay

## 2020-09-17 ENCOUNTER — Ambulatory Visit (INDEPENDENT_AMBULATORY_CARE_PROVIDER_SITE_OTHER): Payer: BC Managed Care – PPO | Admitting: Primary Care

## 2020-09-17 ENCOUNTER — Encounter: Payer: Self-pay | Admitting: Primary Care

## 2020-09-17 VITALS — BP 124/76 | HR 92 | Temp 98.6°F | Ht 66.0 in | Wt 164.0 lb

## 2020-09-17 DIAGNOSIS — E039 Hypothyroidism, unspecified: Secondary | ICD-10-CM

## 2020-09-17 DIAGNOSIS — Z0000 Encounter for general adult medical examination without abnormal findings: Secondary | ICD-10-CM

## 2020-09-17 DIAGNOSIS — Z114 Encounter for screening for human immunodeficiency virus [HIV]: Secondary | ICD-10-CM

## 2020-09-17 DIAGNOSIS — G43009 Migraine without aura, not intractable, without status migrainosus: Secondary | ICD-10-CM | POA: Diagnosis not present

## 2020-09-17 DIAGNOSIS — K5909 Other constipation: Secondary | ICD-10-CM

## 2020-09-17 DIAGNOSIS — Z1159 Encounter for screening for other viral diseases: Secondary | ICD-10-CM

## 2020-09-17 DIAGNOSIS — I10 Essential (primary) hypertension: Secondary | ICD-10-CM | POA: Diagnosis not present

## 2020-09-17 DIAGNOSIS — G8929 Other chronic pain: Secondary | ICD-10-CM

## 2020-09-17 DIAGNOSIS — F411 Generalized anxiety disorder: Secondary | ICD-10-CM

## 2020-09-17 DIAGNOSIS — M79672 Pain in left foot: Secondary | ICD-10-CM

## 2020-09-17 HISTORY — DX: Other chronic pain: G89.29

## 2020-09-17 LAB — LIPID PANEL
Cholesterol: 187 mg/dL (ref 0–200)
HDL: 61.5 mg/dL (ref >39.00)
LDL Cholesterol: 114 mg/dL — ABNORMAL HIGH (ref 0–99)
NonHDL: 125.69
Total CHOL/HDL Ratio: 3
Triglycerides: 58 mg/dL (ref 0.0–149.0)
VLDL: 11.6 mg/dL (ref 0.0–40.0)

## 2020-09-17 LAB — CBC
HCT: 39.9 % (ref 36.0–46.0)
Hemoglobin: 13.6 g/dL (ref 12.0–15.0)
MCHC: 34.1 g/dL (ref 30.0–36.0)
MCV: 91.3 fl (ref 78.0–100.0)
Platelets: 227 10*3/uL (ref 150.0–400.0)
RBC: 4.37 Mil/uL (ref 3.87–5.11)
RDW: 13.7 % (ref 11.5–15.5)
WBC: 8.9 10*3/uL (ref 4.0–10.5)

## 2020-09-17 LAB — COMPREHENSIVE METABOLIC PANEL
ALT: 16 U/L (ref 0–35)
AST: 17 U/L (ref 0–37)
Albumin: 4.5 g/dL (ref 3.5–5.2)
Alkaline Phosphatase: 37 U/L — ABNORMAL LOW (ref 39–117)
BUN: 14 mg/dL (ref 6–23)
CO2: 27 mEq/L (ref 19–32)
Calcium: 9 mg/dL (ref 8.4–10.5)
Chloride: 102 mEq/L (ref 96–112)
Creatinine, Ser: 0.75 mg/dL (ref 0.40–1.20)
GFR: 97.89 mL/min (ref >60.00)
Glucose, Bld: 88 mg/dL (ref 70–99)
Potassium: 3.9 mEq/L (ref 3.5–5.1)
Sodium: 136 mEq/L (ref 135–145)
Total Bilirubin: 0.4 mg/dL (ref 0.2–1.2)
Total Protein: 7.2 g/dL (ref 6.0–8.3)

## 2020-09-17 LAB — TSH: TSH: 3.9 u[IU]/mL (ref 0.35–4.50)

## 2020-09-17 NOTE — Assessment & Plan Note (Signed)
Took Zoloft for a few months and found no improvement. Overall her anxiety is manageable and she will update if anything changes.

## 2020-09-17 NOTE — Patient Instructions (Signed)
Stop by the lab prior to leaving today. I will notify you of your results once received.   Ensure you are consuming 64 ounces of water daily.  Try a stool softener called docusate sodium daily to help with bowel movements.  Start exercising. You should be getting 150 minutes of moderate intensity exercise weekly.  It's important to improve your diet by reducing consumption of fast food, fried food, processed snack foods, sugary drinks. Increase consumption of fresh vegetables and fruits, whole grains, water.  Ensure you are drinking 64 ounces of water daily.  It was a pleasure to see you today!   Constipation, Adult Constipation is when a person has fewer bowel movements in a week than normal, has difficulty having a bowel movement, or has stools that are dry, hard, or larger than normal. Constipation may be caused by an underlying condition. It may become worse with age if a person takes certain medicines and does not take in enough fluids. Follow these instructions at home: Eating and drinking   Eat foods that have a lot of fiber, such as fresh fruits and vegetables, whole grains, and beans.  Limit foods that are high in fat, low in fiber, or overly processed, such as french fries, hamburgers, cookies, candies, and soda.  Drink enough fluid to keep your urine clear or pale yellow. General instructions  Exercise regularly or as told by your health care provider.  Go to the restroom when you have the urge to go. Do not hold it in.  Take over-the-counter and prescription medicines only as told by your health care provider. These include any fiber supplements.  Practice pelvic floor retraining exercises, such as deep breathing while relaxing the lower abdomen and pelvic floor relaxation during bowel movements.  Watch your condition for any changes.  Keep all follow-up visits as told by your health care provider. This is important. Contact a health care provider if:  You have  pain that gets worse.  You have a fever.  You do not have a bowel movement after 4 days.  You vomit.  You are not hungry.  You lose weight.  You are bleeding from the anus.  You have thin, pencil-like stools. Get help right away if:  You have a fever and your symptoms suddenly get worse.  You leak stool or have blood in your stool.  Your abdomen is bloated.  You have severe pain in your abdomen.  You feel dizzy or you faint. This information is not intended to replace advice given to you by your health care provider. Make sure you discuss any questions you have with your health care provider. Document Revised: 11/02/2017 Document Reviewed: 05/10/2016 Elsevier Patient Education  2020 [employer].

## 2020-09-17 NOTE — Assessment & Plan Note (Signed)
Little water intake. Bowel movements once weekly. Has tried Miralax without improvement.  Recommended to increase water intake, increase fiber, start exercising.  Also discussed to try a daily stool softener. She will update.

## 2020-09-17 NOTE — Addendum Note (Signed)
Addended by: Ellamae Sia on: 09/17/2020 02:34 PM   Modules accepted: Orders

## 2020-09-17 NOTE — Assessment & Plan Note (Signed)
Referral placed to podiatry.

## 2020-09-17 NOTE — Assessment & Plan Note (Signed)
Well controlled in the office today, has been off meds for quite some time. Continue off meds, continue to monitor BP.

## 2020-09-17 NOTE — Assessment & Plan Note (Signed)
Has been off of levothyroxine for 2 years. History of Graves disease with iodine treatment. Repeat TSH and Free T4 pending.

## 2020-09-17 NOTE — Assessment & Plan Note (Signed)
Immunizations UTD per patient. Pap smear due this year, follows with GYN. Will get mammogram at GYN office.  Discussed the importance of a healthy diet and regular exercise in order for weight loss, and to reduce the risk of any potential medical problems.  Exam today stable. Labs pending.

## 2020-09-17 NOTE — Progress Notes (Signed)
Subjective:    Patient ID: Sharon Jones, female    DOB: Sep 30, 1976, 44 y.o.   MRN: [id number]  HPI  This visit occurred during the SARS-CoV-2 public health emergency.  Safety protocols were in place, including screening questions prior to the visit, additional usage of staff PPE, and extensive cleaning of exam room while observing appropriate contact time as indicated for disinfecting solutions.   [name] is a 44 year old female who presents today for complete physical.  She would also like to discuss chronic left lateral foot pain that began years ago. As she's getting older she's noticed increased pain. She denies injury/trauma. She's worn comfortable shoes and compression socks without improvement.   Immunizations: -Tetanus: UTD -Influenza:Will get at work -Covid-19: Completed series  Diet: She endorses a healthy diet.  Exercise: No regular exercise   Eye exam: Completes annually  Dental exam: Completes semi-annually  Pap Smear: UTD follows with GYN Mammogram: Completed in 2020  BP Readings from Last 3 Encounters:  09/17/20 124/76  07/30/20 127/83  07/21/20 125/87     Review of Systems  Constitutional: Negative for unexpected weight change.  HENT: Negative for rhinorrhea.   Respiratory: Negative for cough and shortness of breath.   Cardiovascular: Negative for chest pain.  Gastrointestinal: Positive for constipation. Negative for diarrhea.       Once weekly bowel movements  Genitourinary: Negative for difficulty urinating and menstrual problem.  Musculoskeletal: Positive for arthralgias. Negative for myalgias.       Chronic left lateral foot pain  Skin: Negative for rash.  Allergic/Immunologic: Negative for environmental allergies.  Neurological: Negative for dizziness, numbness and headaches.  Psychiatric/Behavioral:       Overall able to manage anxiety on her own       Past Medical History:  Diagnosis Date  . Abnormal finding on Pap smear, ASCUS  1997  . Anxiety    doesn't require meds  . Chronic neck pain    bulding disc  . Dizziness   . Graves disease   . H/O echocardiogram    pt. consulted /w Dr. Acie Fredrickson, 2 + yrs. ago, had ECHO due to Graves disease.   Marland Kitchen Headache    otc med prn  . Hypertension    doesn't require meds  . Hypertrophy, vulva 10/08/2014  . PONV (postoperative nausea and vomiting)   . Rash    arms and feet  . SVD (spontaneous vaginal delivery)    x 1     Social History   Socioeconomic History  . Marital status: Married    Spouse name: Not on file  . Number of children: Not on file  . Years of education: Not on file  . Highest education level: Not on file  Occupational History  . Not on file  Tobacco Use  . Smoking status: Former Smoker    Types: Cigarettes    Quit date: 12/04/2010    Years since quitting: 9.7  . Smokeless tobacco: Never Used  . Tobacco comment: quit in mar 2013-uses EC cigarette  Substance and Sexual Activity  . Alcohol use: Not Currently  . Drug use: No  . Sexual activity: Yes    Birth control/protection: I.U.D.  Other Topics Concern  . Not on file  Social History Narrative   Married.   1 child.   Works at [employer].   Enjoys spending time with her family.   Social Determinants of Health   Financial Resource Strain:   . Difficulty of Paying Living Expenses:  Not on file  Food Insecurity:   . Worried About Charity fundraiser in the Last Year: Not on file  . Ran Out of Food in the Last Year: Not on file  Transportation Needs:   . Lack of Transportation (Medical): Not on file  . Lack of Transportation (Non-Medical): Not on file  Physical Activity:   . Days of Exercise per Week: Not on file  . Minutes of Exercise per Session: Not on file  Stress:   . Feeling of Stress : Not on file  Social Connections:   . Frequency of Communication with Friends and Family: Not on file  . Frequency of Social Gatherings with Friends and Family: Not on file  . Attends Religious Services:  Not on file  . Active Member of Clubs or Organizations: Not on file  . Attends Archivist Meetings: Not on file  . Marital Status: Not on file  Intimate Partner Violence:   . Fear of Current or Ex-Partner: Not on file  . Emotionally Abused: Not on file  . Physically Abused: Not on file  . Sexually Abused: Not on file    Past Surgical History:  Procedure Laterality Date  . ANTERIOR CERVICAL DECOMP/DISCECTOMY FUSION  10/02/2012   Procedure: ANTERIOR CERVICAL DECOMPRESSION/DISCECTOMY FUSION 1 LEVEL;  Surgeon: Sinclair Ship, MD;  Location: [facility];  Service: Orthopedics;  Laterality: Bilateral;  Anterior cervical decompression fusion, 5-6 with instrumentation, allograft.  Marland Kitchen BREAST ENHANCEMENT SURGERY    . CERVICAL CONE BIOPSY     twice  . LABIOPLASTY N/A 10/09/2014   Procedure: LABIAPLASTY;  Surgeon: Janyth Contes, MD;  Location: [facility];  Service: Gynecology;  Laterality: N/A;    Family History  Problem Relation Age of Onset  . Hypertension Mother   . Alcohol abuse Father   . Alcohol abuse Maternal Uncle   . Alcohol abuse Paternal Aunt   . Arthritis Maternal Grandmother   . Alcohol abuse Maternal Grandfather   . Arthritis Maternal Grandfather   . Arthritis Paternal Grandmother   . Arthritis Paternal Grandfather     Allergies  Allergen Reactions  . Ajovy [Fremanezumab-Vfrm] Swelling and Rash    Current Outpatient Medications on File Prior to Visit  Medication Sig Dispense Refill  . baclofen (LIORESAL) 10 MG tablet TAKE 1 TABLET BY MOUTH THREE TIMES A DAY 270 tablet 1  . promethazine (PHENERGAN) 25 MG tablet Take 1 tablet (25 mg total) by mouth every 6 (six) hours as needed for nausea or vomiting. 30 tablet 0  . Rimegepant Sulfate (NURTEC) 75 MG TBDP Take 75 mg by mouth every other day. 16 tablet 11   No current facility-administered medications on file prior to visit.    BP 124/76   Pulse 92   Temp 98.6 F (37 C) (Temporal)   Ht 5\' 6"  (1.676 m)    Wt 164 lb (74.4 kg)   SpO2 96%   BMI 26.47 kg/m    Objective:   Physical Exam HENT:     Right Ear: Tympanic membrane and ear canal normal.     Left Ear: Tympanic membrane and ear canal normal.  Eyes:     Pupils: Pupils are equal, round, and reactive to light.  Cardiovascular:     Rate and Rhythm: Normal rate and regular rhythm.  Pulmonary:     Effort: Pulmonary effort is normal.     Breath sounds: Normal breath sounds.  Abdominal:     General: Bowel sounds are normal.  Palpations: Abdomen is soft.     Tenderness: There is no abdominal tenderness.  Musculoskeletal:        General: Normal range of motion.     Cervical back: Neck supple.  Skin:    General: Skin is warm and dry.     Findings: No erythema.  Neurological:     Mental Status: She is alert and oriented to person, place, and time.     Cranial Nerves: No cranial nerve deficit.     Deep Tendon Reflexes:     Reflex Scores:      Patellar reflexes are 2+ on the right side and 2+ on the left side. Psychiatric:        Mood and Affect: Mood normal.            Assessment & Plan:

## 2020-09-17 NOTE — Assessment & Plan Note (Signed)
Following with a headache specialist, overall feels well managed on Nurtec every other days. Using abortive treatment infrequently.   Continue current regimen.

## 2020-09-17 NOTE — Addendum Note (Signed)
Addended by: Ellamae Sia on: 09/17/2020 02:36 PM   Modules accepted: Orders

## 2020-09-20 LAB — HEPATITIS C ANTIBODY
Hepatitis C Ab: NONREACTIVE
SIGNAL TO CUT-OFF: 0.03 (ref <1.00)

## 2020-09-20 LAB — HIV ANTIBODY (ROUTINE TESTING W REFLEX): HIV 1&2 Ab, 4th Generation: NONREACTIVE

## 2020-10-04 ENCOUNTER — Encounter: Payer: Self-pay | Admitting: *Deleted

## 2020-10-15 ENCOUNTER — Encounter: Payer: Self-pay | Admitting: Podiatry

## 2020-10-15 ENCOUNTER — Other Ambulatory Visit: Payer: Self-pay | Admitting: Podiatry

## 2020-10-15 ENCOUNTER — Ambulatory Visit (INDEPENDENT_AMBULATORY_CARE_PROVIDER_SITE_OTHER): Payer: BC Managed Care – PPO

## 2020-10-15 ENCOUNTER — Other Ambulatory Visit: Payer: Self-pay

## 2020-10-15 ENCOUNTER — Ambulatory Visit: Payer: BC Managed Care – PPO | Admitting: Podiatry

## 2020-10-15 DIAGNOSIS — M21622 Bunionette of left foot: Secondary | ICD-10-CM

## 2020-10-15 DIAGNOSIS — M779 Enthesopathy, unspecified: Secondary | ICD-10-CM

## 2020-10-15 DIAGNOSIS — M7672 Peroneal tendinitis, left leg: Secondary | ICD-10-CM

## 2020-10-15 MED ORDER — MELOXICAM 15 MG PO TABS: 15.0000 mg | ORAL_TABLET | Freq: Every day | ORAL | 1 refills | Status: DC

## 2020-10-15 MED ORDER — METHYLPREDNISOLONE 4 MG PO TBPK: ORAL_TABLET | ORAL | 0 refills | Status: DC

## 2020-10-15 NOTE — Progress Notes (Signed)
° °  HPI: 44 y.o. female presenting today as a new patient for evaluation of pain to the lateral aspect of the left foot this been going on and off for several years now.  Patient currently wears dance go shoes to work.  She denies a history of injury.  She states that certain shoes aggravate her foot more than others.  She has not done anything for treatment she presents for further treatment evaluation  Past Medical History:  Diagnosis Date   Abnormal finding on Pap smear, ASCUS 1997   Anxiety    doesn't require meds   Chronic neck pain    bulding disc   Dizziness    Graves disease    H/O echocardiogram    pt. consulted /w Dr. Acie Fredrickson, 2 + yrs. ago, had ECHO due to Graves disease.    Headache    otc med prn   Hypertension    doesn't require meds   Hypertrophy, vulva 10/08/2014   PONV (postoperative nausea and vomiting)    Rash    arms and feet   SVD (spontaneous vaginal delivery)    x 1     Physical Exam: General: The patient is alert and oriented x3 in no acute distress.  Dermatology: Skin is warm, dry and supple bilateral lower extremities. Negative for open lesions or macerations.  Vascular: Palpable pedal pulses bilaterally. No edema or erythema noted. Capillary refill within normal limits.  Neurological: Epicritic and protective threshold grossly intact bilaterally.   Musculoskeletal Exam: Range of motion within normal limits to all pedal and ankle joints bilateral. Muscle strength 5/5 in all groups bilateral.  Pain on palpation noted to the fifth metatarsal tubercle at the insertion of the peroneal tendon.  There is also some mild pain on palpation to the hypertrophic head of the fifth metatarsal tailor's bunion area.  Radiographic Exam:  Normal osseous mineralization. Joint spaces preserved. No fracture/dislocation/boney destruction.  There is some lateral deviation and hypertrophic head of the fifth metatarsal of the left foot  Assessment: 1.  Insertional  peroneal tendinitis left 2.  Tailor's bunionette left  Plan of Care:  1. Patient evaluated. X-Rays reviewed.  2.  Injection of 0.5 cc Celestone Soluspan injected along the peroneal tendon sheath left at the insertion of the fifth metatarsal tubercle 3.  Prescription for Medrol Dosepak 4.  Prescription for meloxicam 15 mg to begin taking after completion of the Dosepak 5.  Return to clinic in 4 weeks  *Works at [employer].  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 Edrick Kins, DPM    [address], [place] [address]                Office [phone]  Fax [phone]

## 2020-11-19 ENCOUNTER — Ambulatory Visit: Payer: BC Managed Care – PPO | Admitting: Podiatry

## 2021-01-07 IMAGING — US ULTRASOUND RIGHT BREAST LIMITED
1 series · 6 of 6 positions shown · non-contrast
Comparison: Previous exam(s).

CLINICAL DATA: Screening recall for a right breast mass.

EXAM:
DIGITAL DIAGNOSTIC RIGHT MAMMOGRAM WITH CAD AND TOMO
ULTRASOUND RIGHT BREAST

[Series 1: ultrasound right breast limited · 0.06mm/px · 6 of 6 slices shown]
[im 1/6]
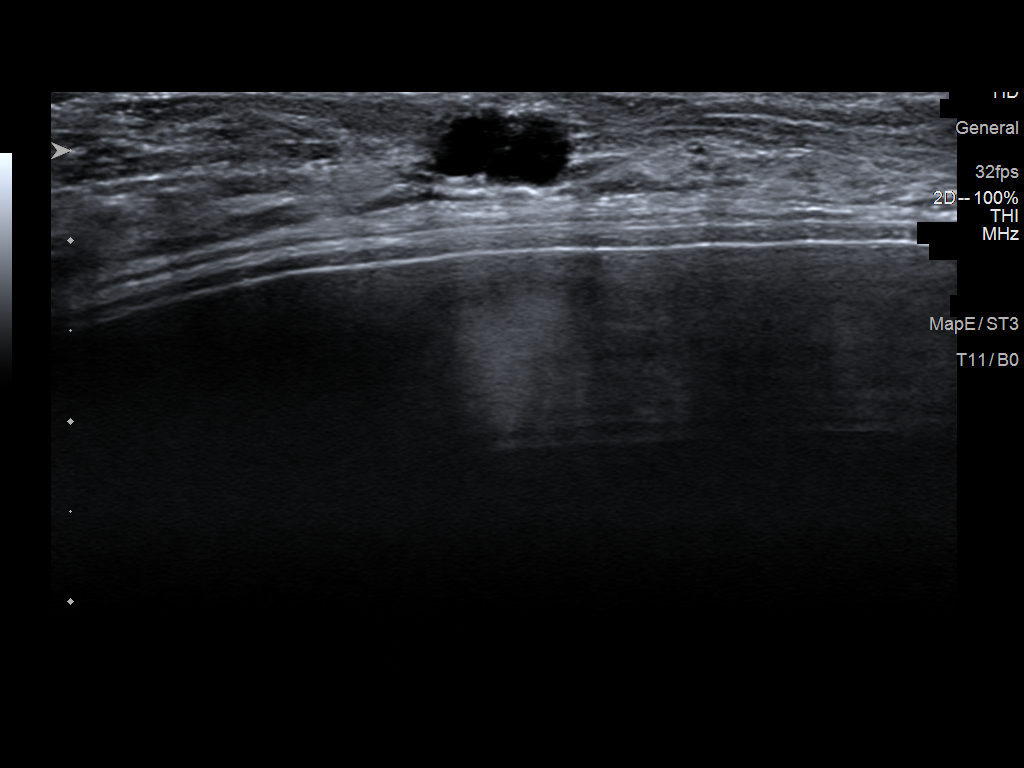
[im 2/6]
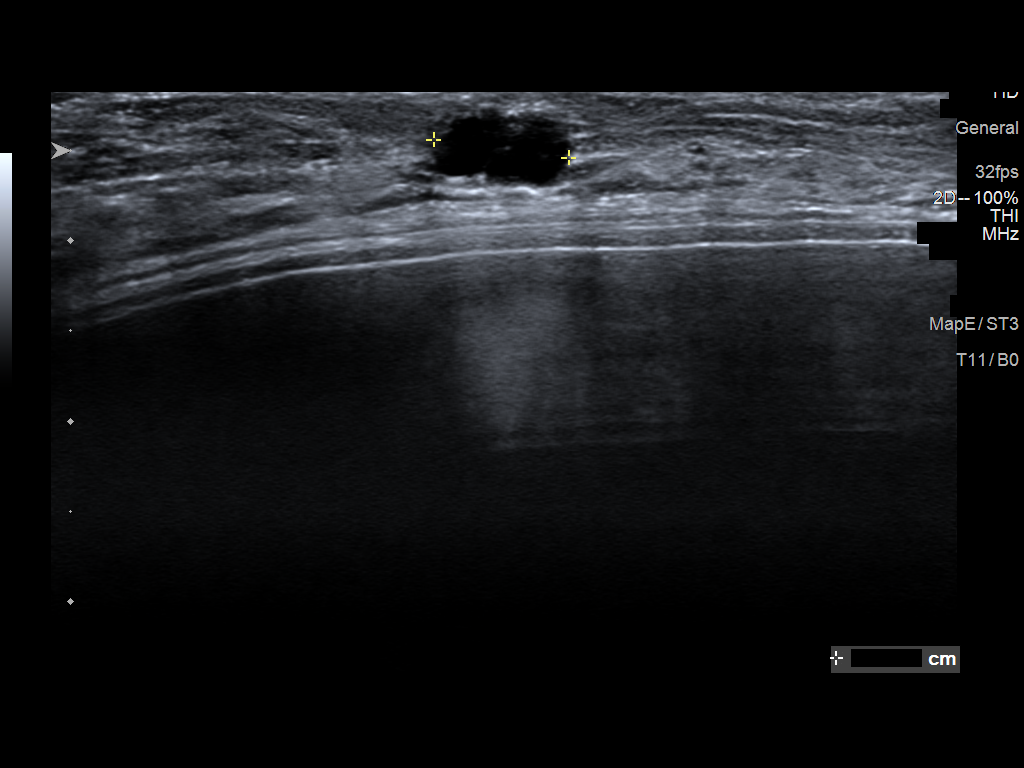
[im 3/6]
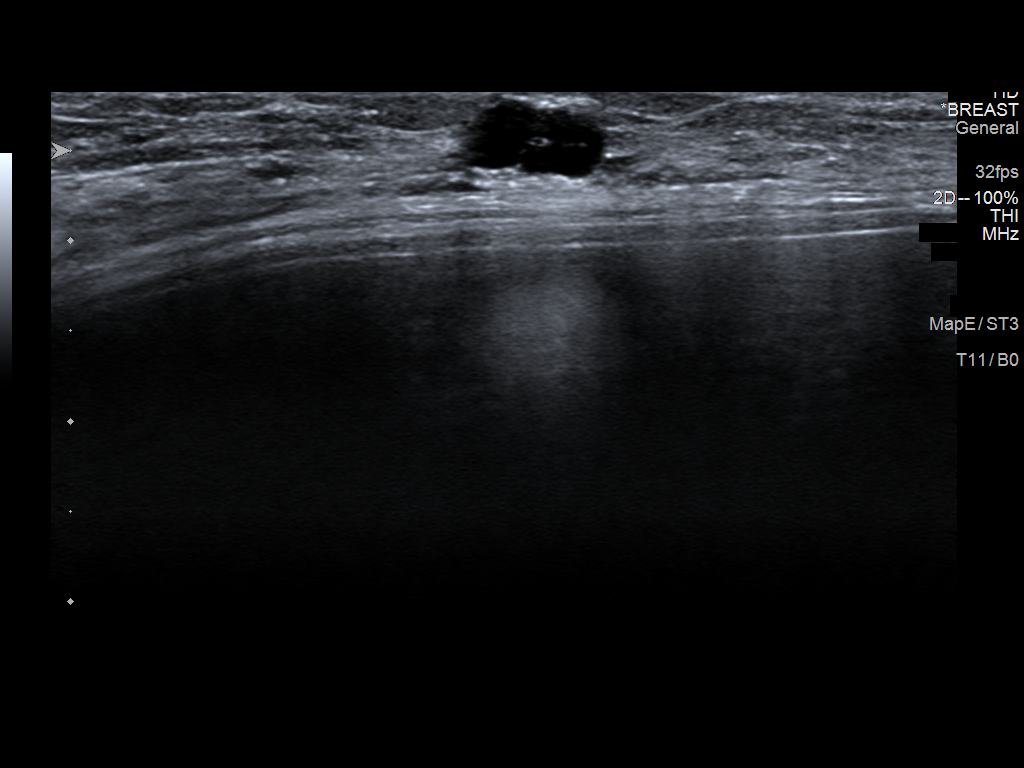
[im 4/6]
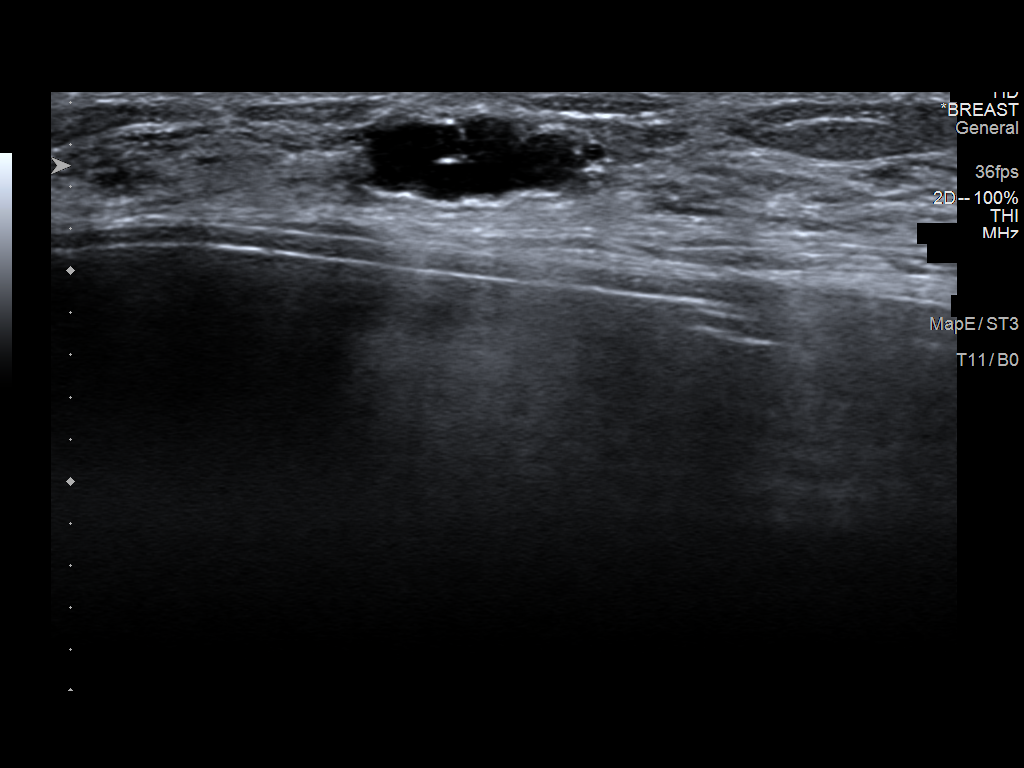
[im 5/6]
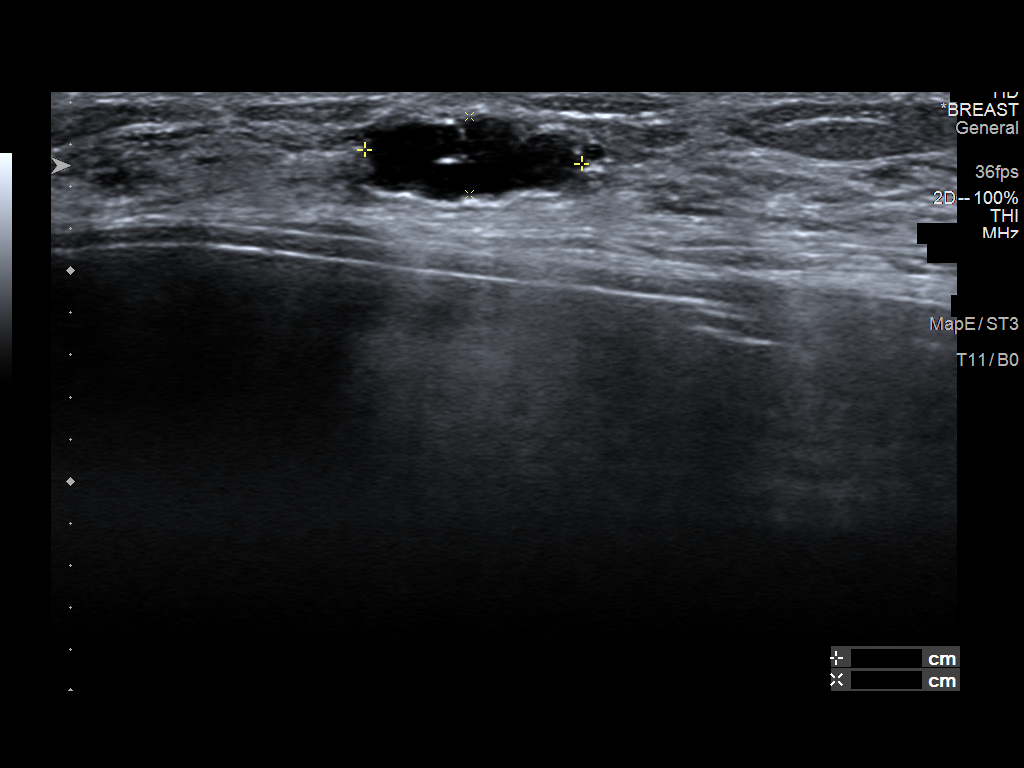
[im 6/6]
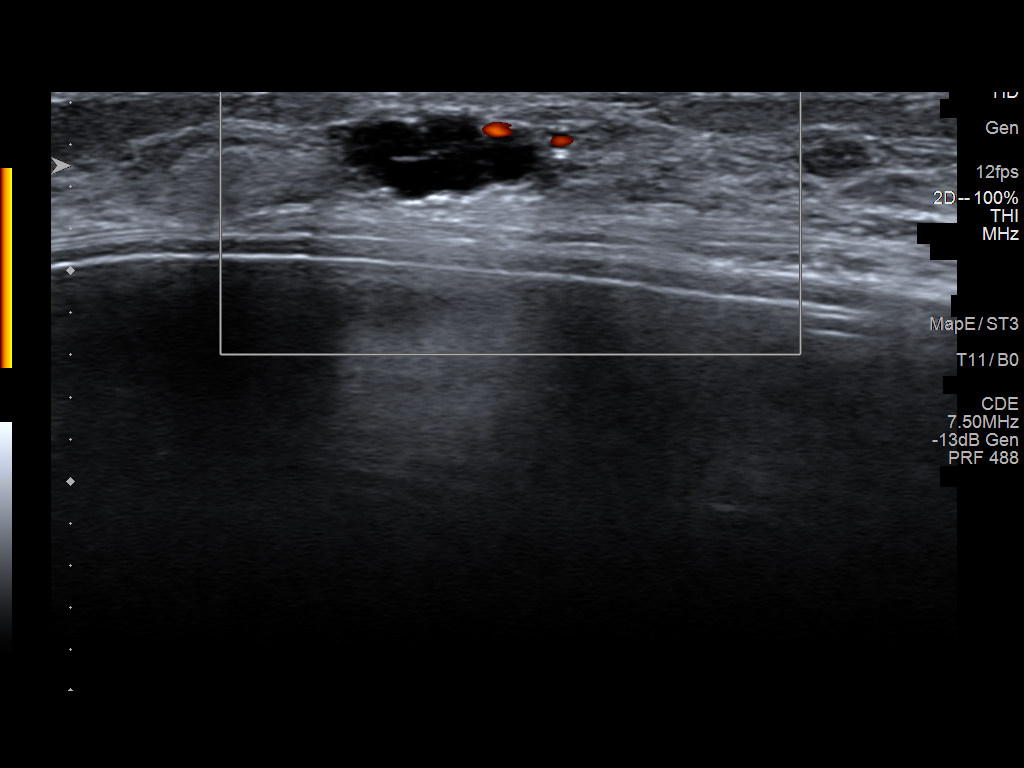

[6 of 6 positions shown; findings below may reference images not displayed]

ACR Breast Density Category c: The breast tissue is heterogeneously
dense, which may obscure small masses.
FINDINGS: Spot compression tomosynthesis images demonstrate a persistent oval
circumscribed mass measuring approximately 6 mm in the upper-outer
right breast.

Mammographic images were processed with CAD.

Ultrasound of the right breast at 10 o'clock, 5 cm from the nipple
demonstrates a cluster of anechoic masses with thin internal
septations measuring together 1.0 x 0.4 x 0.8 cm.
IMPRESSION: The mass in the right breast at 10 o'clock corresponds with a benign
cluster in of cysts.

RECOMMENDATION:
Screening mammogram in one year.(Code:NK-Z-82B)

I have discussed the findings and recommendations with the patient.
Results were also provided in writing at the conclusion of the
visit. If applicable, a reminder letter will be sent to the patient
regarding the next appointment.

BI-RADS CATEGORY  2: Benign.

## 2021-03-08 ENCOUNTER — Other Ambulatory Visit: Payer: Self-pay | Admitting: Podiatry

## 2021-03-08 NOTE — Telephone Encounter (Signed)
Please advise 

## 2021-03-09 ENCOUNTER — Ambulatory Visit (INDEPENDENT_AMBULATORY_CARE_PROVIDER_SITE_OTHER): Payer: BC Managed Care – PPO

## 2021-03-09 ENCOUNTER — Other Ambulatory Visit: Payer: Self-pay

## 2021-03-09 DIAGNOSIS — Z23 Encounter for immunization: Secondary | ICD-10-CM

## 2021-03-09 NOTE — Progress Notes (Signed)
Pt here for Tdap Vaccine. Pt given injection and tolerated well.

## 2021-03-15 NOTE — Progress Notes (Signed)
Attestation of Attending Supervision of clinical support staff: I agree with the care provided to this patient and was available for any consultation.  I have reviewed the RN's note and chart. I was available for consult and to see the patient if needed.   Urie Loughner Niles Carline Dura, MD, MPH, ABFM Attending Physician Faculty Practice- [facility]  

## 2021-03-31 ENCOUNTER — Encounter: Payer: Self-pay | Admitting: *Deleted

## 2021-04-04 ENCOUNTER — Telehealth: Payer: Self-pay

## 2021-04-04 ENCOUNTER — Other Ambulatory Visit: Payer: Self-pay

## 2021-04-04 DIAGNOSIS — R300 Dysuria: Secondary | ICD-10-CM

## 2021-04-04 MED ORDER — SULFAMETHOXAZOLE-TRIMETHOPRIM 800-160 MG PO TABS: 1.0000 | ORAL_TABLET | Freq: Two times a day (BID) | ORAL | 0 refills | Status: AC

## 2021-04-04 NOTE — Telephone Encounter (Signed)
Pt states she has UTI symptoms. Per Dr. Harolyn Rutherford okay to send in antibiotic.

## 2021-04-22 ENCOUNTER — Ambulatory Visit: Payer: BC Managed Care – PPO | Admitting: Primary Care

## 2021-04-22 ENCOUNTER — Telehealth: Payer: Self-pay | Admitting: Primary Care

## 2021-05-03 ENCOUNTER — Other Ambulatory Visit: Payer: Self-pay

## 2021-05-03 ENCOUNTER — Encounter: Payer: Self-pay | Admitting: Primary Care

## 2021-05-03 ENCOUNTER — Ambulatory Visit (INDEPENDENT_AMBULATORY_CARE_PROVIDER_SITE_OTHER): Payer: No Typology Code available for payment source | Admitting: Primary Care

## 2021-05-03 VITALS — BP 118/62 | HR 71 | Temp 98.5°F | Ht 66.0 in | Wt 162.0 lb

## 2021-05-03 DIAGNOSIS — Z111 Encounter for screening for respiratory tuberculosis: Secondary | ICD-10-CM

## 2021-05-03 DIAGNOSIS — F411 Generalized anxiety disorder: Secondary | ICD-10-CM

## 2021-05-03 DIAGNOSIS — E039 Hypothyroidism, unspecified: Secondary | ICD-10-CM | POA: Diagnosis not present

## 2021-05-03 DIAGNOSIS — G43009 Migraine without aura, not intractable, without status migrainosus: Secondary | ICD-10-CM

## 2021-05-03 DIAGNOSIS — Z0000 Encounter for general adult medical examination without abnormal findings: Secondary | ICD-10-CM

## 2021-05-03 DIAGNOSIS — K5909 Other constipation: Secondary | ICD-10-CM | POA: Diagnosis not present

## 2021-05-03 DIAGNOSIS — I10 Essential (primary) hypertension: Secondary | ICD-10-CM

## 2021-05-03 LAB — COMPREHENSIVE METABOLIC PANEL
ALT: 13 U/L (ref 0–35)
AST: 16 U/L (ref 0–37)
Albumin: 4.5 g/dL (ref 3.5–5.2)
Alkaline Phosphatase: 42 U/L (ref 39–117)
BUN: 12 mg/dL (ref 6–23)
CO2: 27 mEq/L (ref 19–32)
Calcium: 9.3 mg/dL (ref 8.4–10.5)
Chloride: 103 mEq/L (ref 96–112)
Creatinine, Ser: 0.71 mg/dL (ref 0.40–1.20)
GFR: 104.27 mL/min (ref >60.00)
Glucose, Bld: 110 mg/dL — ABNORMAL HIGH (ref 70–99)
Potassium: 3.7 mEq/L (ref 3.5–5.1)
Sodium: 139 mEq/L (ref 135–145)
Total Bilirubin: 0.5 mg/dL (ref 0.2–1.2)
Total Protein: 7 g/dL (ref 6.0–8.3)

## 2021-05-03 LAB — T4, FREE: Free T4: 0.67 ng/dL (ref 0.60–1.60)

## 2021-05-03 LAB — TSH: TSH: 2.59 u[IU]/mL (ref 0.35–4.50)

## 2021-05-03 NOTE — Assessment & Plan Note (Signed)
Immunizations UTD. Pap smear and mammogram UTD, follows with GYN.  Discussed the importance of a healthy diet and regular exercise in order for weight loss, and to reduce the risk of any potential medical problems.  Exam today unremarkable. Labs pending.

## 2021-05-03 NOTE — Assessment & Plan Note (Signed)
Doing well overall, continue to monitor.

## 2021-05-03 NOTE — Progress Notes (Signed)
Subjective:    Patient ID: Sharon Jones, female    DOB: 11/24/1976, 45 y.o.   MRN: [id number]  HPI  Sharon Jones is a very pleasant 45 y.o. 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  Immunizations: -Tetanus: 2022 -Influenza: Completed last season  -Covid-19: Completed 2 vaccines  Diet: Fair diet.  Exercise: No regular exercise.  Eye exam: Completes annually  Dental exam: Completes semi-annually   Pap Smear: Follows with GYN Mammogram: Follows with GYN  BP Readings from Last 3 Encounters:  05/03/21 118/62  09/17/20 124/76  07/30/20 127/83      Review of Systems  Constitutional: Negative for unexpected weight change.  HENT: Negative for rhinorrhea.   Eyes: Negative for visual disturbance.  Respiratory: Negative for shortness of breath.   Cardiovascular: Negative for chest pain.  Gastrointestinal: Negative for constipation and diarrhea.  Genitourinary: Negative for difficulty urinating.  Musculoskeletal: Negative for arthralgias and myalgias.  Skin: Negative for rash.  Allergic/Immunologic: Negative for environmental allergies.  Neurological: Negative for dizziness, numbness and headaches.  Psychiatric/Behavioral: The patient is not nervous/anxious.          Past Medical History:  Diagnosis Date  . Abnormal finding on Pap smear, ASCUS 1997  . Anxiety    doesn't require meds  . Chronic neck pain    bulding disc  . Dizziness   . Graves disease   . H/O echocardiogram    pt. consulted /w Dr. Acie Fredrickson, 2 + yrs. ago, had ECHO due to Graves disease.   Marland Kitchen Headache    otc med prn  . Hypertension    doesn't require meds  . Hypertrophy, vulva 10/08/2014  . PONV (postoperative nausea and vomiting)   . Rash    arms and feet  . SVD (spontaneous vaginal delivery)    x 1    Social History   Socioeconomic History  . Marital status: Married    Spouse name: Not on file  .  Number of children: Not on file  . Years of education: Not on file  . Highest education level: Not on file  Occupational History  . Not on file  Tobacco Use  . Smoking status: Former Smoker    Types: Cigarettes    Quit date: 12/04/2010    Years since quitting: 10.4  . Smokeless tobacco: Never Used  . Tobacco comment: quit in mar 2013-uses EC cigarette  Substance and Sexual Activity  . Alcohol use: Not Currently  . Drug use: No  . Sexual activity: Yes    Birth control/protection: I.U.D.  Other Topics Concern  . Not on file  Social History Narrative   Married.   1 child.   Works at [employer].   Enjoys spending time with her family.   Social Determinants of Health   Financial Resource Strain: Not on file  Food Insecurity: Not on file  Transportation Needs: Not on file  Physical Activity: Not on file  Stress: Not on file  Social Connections: Not on file  Intimate Partner Violence: Not on file    Past Surgical History:  Procedure Laterality Date  . ANTERIOR CERVICAL DECOMP/DISCECTOMY FUSION  10/02/2012   Procedure: ANTERIOR CERVICAL DECOMPRESSION/DISCECTOMY FUSION 1 LEVEL;  Surgeon: Sinclair Ship, MD;  Location: [place] [facility];  Service: Orthopedics;  Laterality: Bilateral;  Anterior cervical decompression fusion, 5-6 with instrumentation, allograft.  Marland Kitchen BREAST ENHANCEMENT SURGERY    . CERVICAL CONE BIOPSY  twice  . LABIOPLASTY N/A 10/09/2014   Procedure: LABIAPLASTY;  Surgeon: Janyth Contes, MD;  Location: [facility];  Service: Gynecology;  Laterality: N/A;    Family History  Problem Relation Age of Onset  . Hypertension Mother   . Alcohol abuse Father   . Alcohol abuse Maternal Uncle   . Alcohol abuse Paternal Aunt   . Arthritis Maternal Grandmother   . Alcohol abuse Maternal Grandfather   . Arthritis Maternal Grandfather   . Arthritis Paternal Grandmother   . Arthritis Paternal Grandfather     Allergies  Allergen Reactions  . Ajovy [Fremanezumab-Vfrm] Swelling  and Rash    Current Outpatient Medications on File Prior to Visit  Medication Sig Dispense Refill  . baclofen (LIORESAL) 10 MG tablet TAKE 1 TABLET BY MOUTH THREE TIMES A DAY 270 tablet 1  . meloxicam (MOBIC) 15 MG tablet Take 1 tablet (15 mg total) by mouth daily. 30 tablet 1  . methylPREDNISolone (MEDROL DOSEPAK) 4 MG TBPK tablet 6 day dose pack - take as directed 21 tablet 0  . promethazine (PHENERGAN) 25 MG tablet Take 1 tablet (25 mg total) by mouth every 6 (six) hours as needed for nausea or vomiting. 30 tablet 0  . Rimegepant Sulfate (NURTEC) 75 MG TBDP Take 75 mg by mouth every other day. 16 tablet 11   No current facility-administered medications on file prior to visit.    There were no vitals taken for this visit. Objective:   Physical Exam HENT:     Right Ear: Tympanic membrane and ear canal normal.     Left Ear: Tympanic membrane and ear canal normal.     Nose: Nose normal.  Eyes:     Conjunctiva/sclera: Conjunctivae normal.     Pupils: Pupils are equal, round, and reactive to light.  Neck:     Thyroid: No thyromegaly.  Cardiovascular:     Rate and Rhythm: Normal rate and regular rhythm.     Heart sounds: No murmur heard.   Pulmonary:     Effort: Pulmonary effort is normal.     Breath sounds: Normal breath sounds. No rales.  Abdominal:     General: Bowel sounds are normal.     Palpations: Abdomen is soft.     Tenderness: There is no abdominal tenderness.  Musculoskeletal:        General: Normal range of motion.     Cervical back: Neck supple.  Lymphadenopathy:     Cervical: No cervical adenopathy.  Skin:    General: Skin is warm and dry.     Findings: No rash.  Neurological:     Mental Status: She is alert and oriented to person, place, and time.     Cranial Nerves: No cranial nerve deficit.     Deep Tendon Reflexes: Reflexes are normal and symmetric.  Psychiatric:        Mood and Affect: Mood normal.           Assessment & Plan:      This  visit occurred during the SARS-CoV-2 public health emergency.  Safety protocols were in place, including screening questions prior to the visit, additional usage of staff PPE, and extensive cleaning of exam room while observing appropriate contact time as indicated for disinfecting solutions.

## 2021-05-03 NOTE — Patient Instructions (Signed)
Stop by the lab prior to leaving today. I will notify you of your results once received.   It was a pleasure to see you today!   Preventive Care 40-43 Years Old, Female Preventive care refers to lifestyle choices and visits with your health care provider that can promote health and wellness. This includes:  A yearly physical exam. This is also called an annual wellness visit.  Regular dental and eye exams.  Immunizations.  Screening for certain conditions.  Healthy lifestyle choices, such as: ? Eating a healthy diet. ? Getting regular exercise. ? Not using drugs or products that contain nicotine and tobacco. ? Limiting alcohol use. What can I expect for my preventive care visit? Physical exam Your health care provider will check your:  Height and weight. These may be used to calculate your BMI (body mass index). BMI is a measurement that tells if you are at a healthy weight.  Heart rate and blood pressure.  Body temperature.  Skin for abnormal spots. Counseling Your health care provider may ask you questions about your:  Past medical problems.  Family's medical history.  Alcohol, tobacco, and drug use.  Emotional well-being.  Home life and relationship well-being.  Sexual activity.  Diet, exercise, and sleep habits.  Work and work environment.  Access to firearms.  Method of birth control.  Menstrual cycle.  Pregnancy history. What immunizations do I need? Vaccines are usually given at various ages, according to a schedule. Your health care provider will recommend vaccines for you based on your age, medical history, and lifestyle or other factors, such as travel or where you work.   What tests do I need? Blood tests  Lipid and cholesterol levels. These may be checked every 5 years, or more often if you are over 50 years old.  Hepatitis C test.  Hepatitis B test. Screening  Lung cancer screening. You may have this screening every year starting at  age 55 if you have a 30-pack-year history of smoking and currently smoke or have quit within the past 15 years.  Colorectal cancer screening. ? All adults should have this screening starting at age 50 and continuing until age 75. ? Your health care provider may recommend screening at age 45 if you are at increased risk. ? You will have tests every 1-10 years, depending on your results and the type of screening test.  Diabetes screening. ? This is done by checking your blood sugar (glucose) after you have not eaten for a while (fasting). ? You may have this done every 1-3 years.  Mammogram. ? This may be done every 1-2 years. ? Talk with your health care provider about when you should start having regular mammograms. This may depend on whether you have a family history of breast cancer.  BRCA-related cancer screening. This may be done if you have a family history of breast, ovarian, tubal, or peritoneal cancers.  Pelvic exam and Pap test. ? This may be done every 3 years starting at age 21. ? Starting at age 30, this may be done every 5 years if you have a Pap test in combination with an HPV test. Other tests  STD (sexually transmitted disease) testing, if you are at risk.  Bone density scan. This is done to screen for osteoporosis. You may have this scan if you are at high risk for osteoporosis. Talk with your health care provider about your test results, treatment options, and if necessary, the need for more tests. Follow these instructions   at home: Eating and drinking  Eat a diet that includes fresh fruits and vegetables, whole grains, lean protein, and low-fat dairy products.  Take vitamin and mineral supplements as recommended by your health care provider.  Do not drink alcohol if: ? Your health care provider tells you not to drink. ? You are pregnant, may be pregnant, or are planning to become pregnant.  If you drink alcohol: ? Limit how much you have to 0-1 drink a  day. ? Be aware of how much alcohol is in your drink. In the [place] one drink equals one 12 oz bottle of beer (355 mL), one 5 oz glass of wine (148 mL), or one 1 oz glass of hard liquor (44 mL).   Lifestyle  Take daily care of your teeth and gums. Brush your teeth every morning and night with fluoride toothpaste. Floss one time each day.  Stay active. Exercise for at least 30 minutes 5 or more days each week.  Do not use any products that contain nicotine or tobacco, such as cigarettes, e-cigarettes, and chewing tobacco. If you need help quitting, ask your health care provider.  Do not use drugs.  If you are sexually active, practice safe sex. Use a condom or other form of protection to prevent STIs (sexually transmitted infections).  If you do not wish to become pregnant, use a form of birth control. If you plan to become pregnant, see your health care provider for a prepregnancy visit.  If told by your health care provider, take low-dose aspirin daily starting at age 50.  Find healthy ways to cope with stress, such as: ? Meditation, yoga, or listening to music. ? Journaling. ? Talking to a trusted person. ? Spending time with friends and family. Safety  Always wear your seat belt while driving or riding in a vehicle.  Do not drive: ? If you have been drinking alcohol. Do not ride with someone who has been drinking. ? When you are tired or distracted. ? While texting.  Wear a helmet and other protective equipment during sports activities.  If you have firearms in your house, make sure you follow all gun safety procedures. What's next?  Visit your health care provider once a year for an annual wellness visit.  Ask your health care provider how often you should have your eyes and teeth checked.  Stay up to date on all vaccines. This information is not intended to replace advice given to you by your health care provider. Make sure you discuss any questions you have with your  health care provider. Document Revised: 08/24/2020 Document Reviewed: 08/01/2018 Elsevier Patient Education  2021 Elsevier Inc.  

## 2021-05-03 NOTE — Assessment & Plan Note (Signed)
History of radioactive iodine, no medication since.  Repeat TSH and Free T4 pending.

## 2021-05-03 NOTE — Assessment & Plan Note (Signed)
Doing well without medications. Continue to monitor.  Well controlled today.

## 2021-05-03 NOTE — Assessment & Plan Note (Signed)
Overall controlled on Nurtec 75 mg PRN and Phenergan PRN, continue same. Following with headache specialist.

## 2021-05-03 NOTE — Assessment & Plan Note (Signed)
Denies concerns for anxiety today, continue to monitor.

## 2021-05-05 LAB — QUANTIFERON-TB GOLD PLUS
Mitogen-NIL: >10 IU/mL
NIL: 0.07 IU/mL
QuantiFERON-TB Gold Plus: NEGATIVE
TB1-NIL: <0 IU/mL
TB2-NIL: <0 IU/mL

## 2021-05-10 NOTE — Telephone Encounter (Signed)
Patient came by given to reception and asked to make copy for our records when given to patient.

## 2021-05-10 NOTE — Telephone Encounter (Signed)
Patient called back about her paperwork. Please advise EM

## 2021-08-05 ENCOUNTER — Telehealth: Payer: Self-pay | Admitting: Obstetrics & Gynecology

## 2021-08-05 NOTE — Telephone Encounter (Signed)
I have no experience prescribing Nurtec.  Patient follows with a headache specialist so this request needs to go to her headache specialist's office.

## 2021-08-05 NOTE — Telephone Encounter (Signed)
After hours triage call- Sharon Jones calling requesting refill on Nurtec.  Since this is not a standard OB/GYN medication, I personally did not feel comfortable refilling.  Will forward the message along to provider to work on refill.

## 2021-08-09 ENCOUNTER — Other Ambulatory Visit: Payer: Self-pay | Admitting: *Deleted

## 2021-08-09 MED ORDER — NURTEC 75 MG PO TBDP: 75.0000 mg | ORAL_TABLET | ORAL | 11 refills | Status: DC

## 2021-08-09 NOTE — Telephone Encounter (Signed)
Called patient she will go after October 3 for xray. Given instructions and location of where to go. If any changes or questions she will call and we will set up for appointment.

## 2021-09-09 ENCOUNTER — Other Ambulatory Visit: Payer: Self-pay

## 2021-09-09 ENCOUNTER — Encounter: Payer: Self-pay | Admitting: Physician Assistant

## 2021-09-09 ENCOUNTER — Other Ambulatory Visit (HOSPITAL_COMMUNITY): Payer: Self-pay

## 2021-09-09 ENCOUNTER — Ambulatory Visit (INDEPENDENT_AMBULATORY_CARE_PROVIDER_SITE_OTHER): Payer: No Typology Code available for payment source | Admitting: Physician Assistant

## 2021-09-09 DIAGNOSIS — G43009 Migraine without aura, not intractable, without status migrainosus: Secondary | ICD-10-CM

## 2021-09-09 DIAGNOSIS — M62838 Other muscle spasm: Secondary | ICD-10-CM | POA: Diagnosis not present

## 2021-09-09 MED ORDER — CYCLOBENZAPRINE HCL 10 MG PO TABS
10.0000 mg | ORAL_TABLET | Freq: Three times a day (TID) | ORAL | 2 refills | Status: DC | PRN
  Filled 2021-09-09: qty 40, 14d supply, fill #0

## 2021-09-09 MED ORDER — BOTOX 100 UNITS IJ SOLR
100.0000 [IU] | Freq: Once | INTRAMUSCULAR | 3 refills | Status: AC
  Filled 2021-09-09: qty 1, 30d supply, fill #0

## 2021-09-09 NOTE — Patient Instructions (Signed)
OnabotulinumtoxinA injection (Medical Use) What is this medication? ONABOTULINUMTOXINA (o na BOTT you lye num tox in eh) is a neuro-muscular blocker. This medicine is used to treat crossed eyes, eyelid spasms, severe neck muscle spasms, ankle and toe muscle spasms, and elbow, wrist, and finger muscle spasms. It is also used to treat excessive underarm sweating, to prevent chronic migraine headaches, and to treat loss of bladder control due to neurologic conditions such as multiple sclerosis or spinal cord injury. This medicine may be used for other purposes; ask your health care provider or pharmacist if you have questions. COMMON BRAND NAME(S): Botox What should I tell my care team before I take this medication? They need to know if you have any of these conditions: breathing problems cerebral palsy spasms difficulty urinating heart problems history of surgery where this medicine is going to be used infection at the site where this medicine is going to be used myasthenia gravis or other neurologic disease nerve or muscle disease surgery plans take medicines that treat or prevent blood clots thyroid problems an unusual or allergic reaction to botulinum toxin, albumin, other medicines, foods, dyes, or preservatives pregnant or trying to get pregnant breast-feeding How should I use this medication? This medicine is for injection into a muscle. It is given by a health care professional in a hospital or clinic setting. Talk to your pediatrician regarding the use of this medicine in children. While this drug may be prescribed for children as young as 72 years old for selected conditions, precautions do apply. Overdosage: If you think you have taken too much of this medicine contact a poison control center or emergency room at once. NOTE: This medicine is only for you. Do not share this medicine with others. What if I miss a dose? This does not apply. What may interact with this  medication? aminoglycoside antibiotics like gentamicin, neomycin, tobramycin muscle relaxants other botulinum toxin injections This list may not describe all possible interactions. Give your health care provider a list of all the medicines, herbs, non-prescription drugs, or dietary supplements you use. Also tell them if you smoke, drink alcohol, or use illegal drugs. Some items may interact with your medicine. What should I watch for while using this medication? Visit your doctor for regular check ups. This medicine will cause weakness in the muscle where it is injected. Tell your doctor if you feel unusually weak in other muscles. Get medical help right away if you have problems with breathing, swallowing, or talking. This medicine might make your eyelids droop or make you see blurry or double. If you have weak muscles or trouble seeing do not drive a car, use machinery, or do other dangerous activities. This medicine contains albumin from human blood. It may be possible to pass an infection in this medicine, but no cases have been reported. Talk to your doctor about the risks and benefits of this medicine. If your activities have been limited by your condition, go back to your regular routine slowly after treatment with this medicine. What side effects may I notice from receiving this medication? Side effects that you should report to your doctor or health care professional as soon as possible: allergic reactions like skin rash, itching or hives, swelling of the face, lips, or tongue breathing problems changes in vision chest pain or tightness eye irritation, pain fast, irregular heartbeat infection numbness speech problems swallowing problems unusual weakness Side effects that usually do not require medical attention (report to your doctor or health care professional  if they continue or are bothersome): bruising or pain at site where injected drooping eyelid dry eyes or  mouth headache muscles aches, pains sensitivity to light tearing This list may not describe all possible side effects. Call your doctor for medical advice about side effects. You may report side effects to FDA at 1-800-FDA-1088. Where should I keep my medication? This drug is given in a hospital or clinic and will not be stored at home. NOTE: This sheet is a summary. It may not cover all possible information. If you have questions about this medicine, talk to your doctor, pharmacist, or health care provider.  2022 Elsevier/Gold Standard (2018-05-27 14:21:42)

## 2021-09-09 NOTE — Progress Notes (Signed)
History:  Sharon Jones is a 45 y.o. [phone] who presents to clinic today for headache followup. She notes more frequent headaches over the last few months.  She is now in school, having more stress, albeit good.  She has iud x 3-4 years and has recently started having irregular cycles.  She is using nurtec every other day.  She still has headaches break through.  She is sleeping sitting up at times to keep the headache at [place].  This is not consistent and she notes there are days that she can lie flat to sleep with no pain.  H/o spinal cord compression requiring emergent surgery in 2015. She has a plate and screws in her neck.  She often has severe tension of the neck and shoulders.  She has used flexeril but with noted constipation.  She is thinking baclofen caused even more constipation.  She is willing to trial flexeril again as her muscles spasm is so pronounced.  She would like to use Botox for prevention.     Past Medical History:  Diagnosis Date   Abnormal finding on Pap smear, ASCUS 1997   Anxiety    doesn't require meds   Chronic neck pain    bulding disc   Dizziness    Graves disease    H/O echocardiogram    pt. consulted /w Dr. Acie Fredrickson, 2 + yrs. ago, had ECHO due to Graves disease.    Headache    otc med prn   Hypertension    doesn't require meds   Hypertrophy, vulva 10/08/2014   PONV (postoperative nausea and vomiting)    Rash    arms and feet   SVD (spontaneous vaginal delivery)    x 1    Social History   Socioeconomic History   Marital status: Married    Spouse name: Not on file   Number of children: Not on file   Years of education: Not on file   Highest education level: Not on file  Occupational History   Not on file  Tobacco Use   Smoking status: Former    Types: Cigarettes    Quit date: 12/04/2010    Years since quitting: 10.7   Smokeless tobacco: Never   Tobacco comments:    quit in mar 2013-uses EC cigarette  Substance and Sexual Activity   Alcohol  use: Not Currently   Drug use: No   Sexual activity: Yes    Birth control/protection: I.U.D.  Other Topics Concern   Not on file  Social History Narrative   Married.   1 child.   Works at [employer].   Enjoys spending time with her family.   Social Determinants of Health   Financial Resource Strain: Not on file  Food Insecurity: Not on file  Transportation Needs: Not on file  Physical Activity: Not on file  Stress: Not on file  Social Connections: Not on file  Intimate Partner Violence: Not on file    Family History  Problem Relation Age of Onset   Hypertension Mother    Alcohol abuse Father    Alcohol abuse Maternal Uncle    Alcohol abuse Paternal Aunt    Arthritis Maternal Grandmother    Alcohol abuse Maternal Grandfather    Arthritis Maternal Grandfather    Arthritis Paternal Grandmother    Arthritis Paternal Grandfather     Allergies  Allergen Reactions   Ajovy [Fremanezumab-Vfrm] Swelling and Rash    Current Outpatient Medications on File Prior to Visit  Medication Sig  Dispense Refill   promethazine (PHENERGAN) 25 MG tablet Take 1 tablet (25 mg total) by mouth every 6 (six) hours as needed for nausea or vomiting. 30 tablet 0   Rimegepant Sulfate (NURTEC) 75 MG TBDP Take 75 mg by mouth every other day. 16 tablet 11   No current facility-administered medications on file prior to visit.     Review of Systems:  All pertinent positive/negative included in HPI, all other review of systems are negative   Objective:  Physical Exam There were no vitals taken for this visit. CONSTITUTIONAL: Well-developed, well-nourished female in no acute distress.  EYES: EOM intact ENT: Normocephalic MUSCULOSKELETAL: Normal ROM, strength equal bilaterally, significant muscle spasm noted SKIN: Warm, dry without erythema  NEUROLOGICAL: Alert, oriented, CN II-XII grossly intact, Appropriate balance PSYCH: Normal behavior, mood   Assessment & Plan:  Assessment: 1. Muscle spasm    2. Migraine without aura and without status migrainosus, not intractable      Plan: Botox for prevention of migraine, will aid in muscle spasm as well: will begin with 100 units every 3 months.  Can increase number of units if that is needed.   Continue nurtec qod at this time Flexeril for muscle spasm/tension.  Use liberally but with sedation precautions RTC 3 months or as soon as we are able to get botox.     Paticia Stack, PA-C 09/09/2021 11:52 AM

## 2021-09-13 ENCOUNTER — Other Ambulatory Visit (HOSPITAL_COMMUNITY): Payer: Self-pay

## 2021-09-15 ENCOUNTER — Encounter: Payer: Self-pay | Admitting: *Deleted

## 2021-09-16 ENCOUNTER — Other Ambulatory Visit (HOSPITAL_COMMUNITY): Payer: Self-pay

## 2021-09-19 ENCOUNTER — Other Ambulatory Visit (HOSPITAL_COMMUNITY): Payer: Self-pay

## 2021-09-20 ENCOUNTER — Other Ambulatory Visit (HOSPITAL_COMMUNITY): Payer: Self-pay

## 2021-09-30 ENCOUNTER — Other Ambulatory Visit: Payer: Self-pay | Admitting: Physician Assistant

## 2021-09-30 ENCOUNTER — Other Ambulatory Visit (HOSPITAL_COMMUNITY): Payer: Self-pay

## 2021-09-30 MED ORDER — METOPROLOL TARTRATE 25 MG PO TABS
25.0000 mg | ORAL_TABLET | Freq: Every day | ORAL | 11 refills | Status: DC
  Filled 2021-09-30 – 2021-10-14 (×2): qty 30, 30d supply, fill #0

## 2021-09-30 NOTE — Progress Notes (Signed)
Pt continuing have frequent migraines.  Botox was declined.   Will trial Metoprolol 25mg  for migraine prevention.

## 2021-10-10 ENCOUNTER — Other Ambulatory Visit (HOSPITAL_COMMUNITY): Payer: Self-pay

## 2021-10-14 ENCOUNTER — Other Ambulatory Visit (HOSPITAL_COMMUNITY): Payer: Self-pay

## 2021-12-02 ENCOUNTER — Telehealth: Payer: Self-pay | Admitting: Physician Assistant

## 2021-12-02 ENCOUNTER — Other Ambulatory Visit (HOSPITAL_COMMUNITY): Payer: Self-pay

## 2021-12-02 MED ORDER — METRONIDAZOLE 0.75 % VA GEL
1.0000 | Freq: Every day | VAGINAL | 5 refills | Status: DC
  Filled 2021-12-02: qty 70, 5d supply, fill #0

## 2021-12-02 NOTE — Telephone Encounter (Signed)
Needing metrogel for recurrent infection.

## 2022-03-03 ENCOUNTER — Telehealth: Payer: Self-pay | Admitting: Physician Assistant

## 2022-03-03 MED ORDER — VENLAFAXINE HCL ER 37.5 MG PO CP24: 37.5000 mg | ORAL_CAPSULE | Freq: Two times a day (BID) | ORAL | 2 refills | Status: DC | PRN

## 2022-03-03 NOTE — Telephone Encounter (Signed)
Pt notes increasing numbers of headaches.  Metoprolol did not help migraine prevention.  ? ?She would like to try effexor after seeing improvements in a friend.  Will give 37.'5mg'$  tabs and titrate to '75mg'$ .   ?She will let me know when she is able to do '75mg'$  regularly and I can switch her rx to that dose.   ?Potential side effects discussed.  She can call with questions.   ? ?

## 2022-03-17 ENCOUNTER — Other Ambulatory Visit: Payer: Self-pay | Admitting: Physician Assistant

## 2022-04-03 ENCOUNTER — Other Ambulatory Visit: Payer: Self-pay | Admitting: Family Medicine

## 2022-04-03 DIAGNOSIS — Z111 Encounter for screening for respiratory tuberculosis: Secondary | ICD-10-CM

## 2022-08-23 ENCOUNTER — Other Ambulatory Visit: Payer: Self-pay | Admitting: Physician Assistant

## 2022-08-29 ENCOUNTER — Other Ambulatory Visit: Payer: Self-pay | Admitting: *Deleted

## 2022-08-29 MED ORDER — NURTEC 75 MG PO TBDP: 75.0000 mg | ORAL_TABLET | ORAL | 11 refills | Status: DC

## 2022-08-29 NOTE — Telephone Encounter (Signed)
Erroneous duplicate

## 2022-10-22 ENCOUNTER — Telehealth: Payer: Self-pay | Admitting: Physician Assistant

## 2022-10-22 DIAGNOSIS — H6500 Acute serous otitis media, unspecified ear: Secondary | ICD-10-CM

## 2022-10-22 MED ORDER — AMOXICILLIN-POT CLAVULANATE 875-125 MG PO TABS: 1.0000 | ORAL_TABLET | Freq: Two times a day (BID) | ORAL | 0 refills | Status: DC

## 2022-10-22 NOTE — Telephone Encounter (Signed)
Pt with several days of ear pain.  Pt states this feels like prior ear infections.  She requests treatment for ear infection.   Will treat augmentin.

## 2023-05-11 ENCOUNTER — Ambulatory Visit (INDEPENDENT_AMBULATORY_CARE_PROVIDER_SITE_OTHER): Payer: 59 | Admitting: Primary Care

## 2023-05-11 ENCOUNTER — Encounter: Payer: Self-pay | Admitting: Primary Care

## 2023-05-11 VITALS — BP 158/96 | HR 101 | Temp 97.3°F | Ht 66.0 in | Wt 169.0 lb

## 2023-05-11 DIAGNOSIS — E039 Hypothyroidism, unspecified: Secondary | ICD-10-CM

## 2023-05-11 DIAGNOSIS — G43009 Migraine without aura, not intractable, without status migrainosus: Secondary | ICD-10-CM | POA: Diagnosis not present

## 2023-05-11 DIAGNOSIS — I10 Essential (primary) hypertension: Secondary | ICD-10-CM

## 2023-05-11 LAB — COMPREHENSIVE METABOLIC PANEL
ALT: 10 U/L (ref 0–35)
AST: 16 U/L (ref 0–37)
Albumin: 4.6 g/dL (ref 3.5–5.2)
Alkaline Phosphatase: 38 U/L — ABNORMAL LOW (ref 39–117)
BUN: 12 mg/dL (ref 6–23)
CO2: 26 mEq/L (ref 19–32)
Calcium: 9.1 mg/dL (ref 8.4–10.5)
Chloride: 105 mEq/L (ref 96–112)
Creatinine, Ser: 0.65 mg/dL (ref 0.40–1.20)
GFR: 106.45 mL/min (ref >60.00)
Glucose, Bld: 97 mg/dL (ref 70–99)
Potassium: 4 mEq/L (ref 3.5–5.1)
Sodium: 138 mEq/L (ref 135–145)
Total Bilirubin: 0.3 mg/dL (ref 0.2–1.2)
Total Protein: 7.3 g/dL (ref 6.0–8.3)

## 2023-05-11 LAB — CBC
HCT: 40.9 % (ref 36.0–46.0)
Hemoglobin: 14 g/dL (ref 12.0–15.0)
MCHC: 34.3 g/dL (ref 30.0–36.0)
MCV: 92.1 fl (ref 78.0–100.0)
Platelets: 245 10*3/uL (ref 150.0–400.0)
RBC: 4.45 Mil/uL (ref 3.87–5.11)
RDW: 12.9 % (ref 11.5–15.5)
WBC: 4.4 10*3/uL (ref 4.0–10.5)

## 2023-05-11 LAB — T4, FREE: Free T4: 0.7 ng/dL (ref 0.60–1.60)

## 2023-05-11 LAB — TSH: TSH: 4.31 u[IU]/mL (ref 0.35–5.50)

## 2023-05-11 MED ORDER — LISINOPRIL-HYDROCHLOROTHIAZIDE 10-12.5 MG PO TABS
1.0000 | ORAL_TABLET | Freq: Every day | ORAL | 0 refills | Status: DC
Start: 2023-05-11 — End: 2023-08-06
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
## 2023-05-11 NOTE — Assessment & Plan Note (Addendum)
Uncontrolled, likely also due to uncontrolled hypertension.  Will work to treat blood pressure. Will see her back in a couple of weeks for follow-up.  Recommended MRI brain, discussed with patient today.  She will think about this. Reviewed CT head from 2019.

## 2023-05-11 NOTE — Assessment & Plan Note (Addendum)
Uncontrolled today, also with readings outside of the clinic.   Significant family history.  Start lisinopril-HCTZ 10-12.5 mg   CMP, CBC, TSH pending.  Will plan to see her back in 2 weeks for blood pressure follow-up.

## 2023-05-11 NOTE — Patient Instructions (Signed)
Stop by the lab prior to leaving today. I will notify you of your results once received.   Start lisinopril-hydrochlorothiazide 10-12.5 milligrams once daily for blood pressure.  Continue monitoring your blood pressure.  Please schedule your physical for June 21.  It was a pleasure to see you today!

## 2023-05-11 NOTE — Assessment & Plan Note (Signed)
Remain off treatment.  Repeat thyroid studies pending.

## 2023-05-18 ENCOUNTER — Encounter: Payer: 59 | Admitting: Primary Care

## 2023-05-25 ENCOUNTER — Encounter: Payer: Self-pay | Admitting: Primary Care

## 2023-05-25 ENCOUNTER — Ambulatory Visit (INDEPENDENT_AMBULATORY_CARE_PROVIDER_SITE_OTHER): Payer: BLUE CROSS/BLUE SHIELD | Admitting: Primary Care

## 2023-05-25 VITALS — BP 124/68 | HR 88 | Temp 97.9°F | Ht 66.0 in | Wt 168.0 lb

## 2023-05-25 DIAGNOSIS — Z0000 Encounter for general adult medical examination without abnormal findings: Secondary | ICD-10-CM

## 2023-05-25 DIAGNOSIS — Z1231 Encounter for screening mammogram for malignant neoplasm of breast: Secondary | ICD-10-CM

## 2023-05-25 DIAGNOSIS — G43009 Migraine without aura, not intractable, without status migrainosus: Secondary | ICD-10-CM | POA: Diagnosis not present

## 2023-05-25 DIAGNOSIS — E039 Hypothyroidism, unspecified: Secondary | ICD-10-CM | POA: Diagnosis not present

## 2023-05-25 DIAGNOSIS — I10 Essential (primary) hypertension: Secondary | ICD-10-CM

## 2023-05-25 DIAGNOSIS — F411 Generalized anxiety disorder: Secondary | ICD-10-CM

## 2023-05-25 LAB — BASIC METABOLIC PANEL
BUN: 17 mg/dL (ref 6–23)
CO2: 30 mEq/L (ref 19–32)
Calcium: 9.1 mg/dL (ref 8.4–10.5)
Chloride: 101 mEq/L (ref 96–112)
Creatinine, Ser: 0.79 mg/dL (ref 0.40–1.20)
GFR: 90.41 mL/min (ref >60.00)
Glucose, Bld: 87 mg/dL (ref 70–99)
Potassium: 3.8 mEq/L (ref 3.5–5.1)
Sodium: 137 mEq/L (ref 135–145)

## 2023-05-25 LAB — LIPID PANEL
Cholesterol: 199 mg/dL (ref 0–200)
HDL: 59.7 mg/dL (ref >39.00)
LDL Cholesterol: 130 mg/dL — ABNORMAL HIGH (ref 0–99)
NonHDL: 139.63
Total CHOL/HDL Ratio: 3
Triglycerides: 50 mg/dL (ref 0.0–149.0)
VLDL: 10 mg/dL (ref 0.0–40.0)

## 2023-05-25 MED ORDER — NURTEC 75 MG PO TBDP
ORAL_TABLET | ORAL | 0 refills | Status: DC
Start: 2023-05-25 — End: 2023-06-08

## 2023-05-25 NOTE — Assessment & Plan Note (Signed)
Controlled. Reviewed thyroid level from last visit.  Remain off medication and continue to monitor.

## 2023-05-25 NOTE — Assessment & Plan Note (Signed)
Improved and at goal.  Continue lisinopril-hydrochlorothiazide 10-12.5 mg daily. BMP pending.

## 2023-05-25 NOTE — Assessment & Plan Note (Signed)
Immunizations UTD. Pap smear due, she is scheduled with GYN Mammogram due, orders placed. Colonoscopy due, she declines today.   Discussed the importance of a healthy diet and regular exercise in order for weight loss, and to reduce the risk of further co-morbidity.  Exam stable. Labs pending.  Follow up in 1 year for repeat physical.

## 2023-05-25 NOTE — Progress Notes (Signed)
Subjective:    Patient ID: Sharon Jones, female    DOB: 23-Oct-1976, 47 y.o.   MRN: [id number]  HPI  Sharon Jones is a very pleasant 47 y.o. female who presents today for complete physical and follow up of chronic condition and hypertension.   She was last evaluated on 05/11/2023 to discuss new onset hypertension and migraines.  Prior history of hypertension but had been off medication for several years due to good control with lifestyle changes.  During her last visit her blood pressure was uncontrolled, also with home readings so we initiated lisinopril-hydrochlorothiazide 10-12.5 mg daily.  She is here for follow-up today.  Since her last visit she's feeling better. She's noticed an overall decrease in her headaches.  She denies dizziness, chest pain.  Immunizations: -Tetanus: Completed in 2022  Diet: Fair diet.  Exercise: No regular exercise.  Eye exam: Completed 1 year ago, has an appointment scheduled.  Dental exam: Completed several years ago. Scheduled for July.  Pap Smear: Due, and is scheduled with GYN.  Mammogram: Completed in 2020   Colonoscopy: Never completed  BP Readings from Last 3 Encounters:  05/25/23 124/68  05/11/23 (!) 158/96  05/03/21 118/62      Review of Systems  Constitutional:  Negative for unexpected weight change.  HENT:  Negative for rhinorrhea.   Respiratory:  Negative for cough and shortness of breath.   Cardiovascular:  Negative for chest pain.  Gastrointestinal:  Negative for constipation and diarrhea.  Genitourinary:  Negative for difficulty urinating.  Musculoskeletal:  Negative for arthralgias and myalgias.  Skin:  Negative for rash.  Allergic/Immunologic: Negative for environmental allergies.  Neurological:  Negative for dizziness and headaches.  Psychiatric/Behavioral:  The patient is not nervous/anxious.          Past Medical History:  Diagnosis Date   Abnormal finding on Pap smear, ASCUS 12/05/1995   Anxiety     doesn't require meds   Cervicogenic headache 02/13/2019   Chronic neck pain    bulding disc   Chronic pain in left foot 09/17/2020   Dizziness    Graves disease    H/O chlamydia infection 03/09/1999   H/O echocardiogram    pt. consulted /w Dr. Elease Hashimoto, 2 + yrs. ago, had ECHO due to Graves disease.    Headache    otc med prn   Hypertension    doesn't require meds   Hypertrophy, vulva 10/08/2014   Nonallopathic lesion of cervical region 02/13/2019   Nonallopathic lesion of lumbosacral region 02/13/2019   Nonallopathic lesion of rib cage 02/13/2019   Nonallopathic lesion of sacral region 02/13/2019   Nonallopathic lesion of thoracic region 02/13/2019   PONV (postoperative nausea and vomiting)    Rash    arms and feet   SVD (spontaneous vaginal delivery)    x 1    Social History   Socioeconomic History   Marital status: Married    Spouse name: Not on file   Number of children: Not on file   Years of education: Not on file   Highest education level: Not on file  Occupational History   Not on file  Tobacco Use   Smoking status: Every Day    Types: E-cigarettes   Smokeless tobacco: Never   Tobacco comments:    quit in mar 2013-uses EC cigarette  Substance and Sexual Activity   Alcohol use: Not Currently   Drug use: No   Sexual activity: Yes    Birth control/protection: I.U.D.  Other Topics  Concern   Not on file  Social History Narrative   Married.   1 child.   Works at [employer].   Enjoys spending time with her family.   Social Determinants of Health   Financial Resource Strain: Not on file  Food Insecurity: Not on file  Transportation Needs: Not on file  Physical Activity: Not on file  Stress: Not on file  Social Connections: Not on file  Intimate Partner Violence: Not on file    Past Surgical History:  Procedure Laterality Date   ANTERIOR CERVICAL DECOMP/DISCECTOMY FUSION  10/02/2012   Procedure: ANTERIOR CERVICAL DECOMPRESSION/DISCECTOMY FUSION 1 LEVEL;   Surgeon: Emilee Hero, MD;  Location: [facility] OR;  Service: Orthopedics;  Laterality: Bilateral;  Anterior cervical decompression fusion, 5-6 with instrumentation, allograft.   BREAST ENHANCEMENT SURGERY     CERVICAL CONE BIOPSY     twice   LABIOPLASTY N/A 10/09/2014   Procedure: LABIAPLASTY;  Surgeon: Sherian Rein, MD;  Location: [facility] ORS;  Service: Gynecology;  Laterality: N/A;    Family History  Problem Relation Age of Onset   Hypertension Mother    Alcohol abuse Father    Alcohol abuse Maternal Uncle    Alcohol abuse Paternal Aunt    Arthritis Maternal Grandmother    Alcohol abuse Maternal Grandfather    Arthritis Maternal Grandfather    Arthritis Paternal Grandmother    Arthritis Paternal Grandfather     Allergies  Allergen Reactions   Ajovy [Fremanezumab-Vfrm] Swelling and Rash    Current Outpatient Medications on File Prior to Visit  Medication Sig Dispense Refill   lisinopril-hydrochlorothiazide (ZESTORETIC) 10-12.5 MG tablet Take 1 tablet by mouth daily. for blood pressure. 90 tablet 0   No current facility-administered medications on file prior to visit.    BP 124/68   Pulse 88   Temp 97.9 F (36.6 C) (Temporal)   Ht 5\' 6"  (1.676 m)   Wt 168 lb (76.2 kg)   SpO2 98%   BMI 27.12 kg/m  Objective:   Physical Exam HENT:     Right Ear: Tympanic membrane and ear canal normal.     Left Ear: Tympanic membrane and ear canal normal.     Nose: Nose normal.  Eyes:     Conjunctiva/sclera: Conjunctivae normal.     Pupils: Pupils are equal, round, and reactive to light.  Neck:     Thyroid: No thyromegaly.  Cardiovascular:     Rate and Rhythm: Normal rate and regular rhythm.     Heart sounds: No murmur heard. Pulmonary:     Effort: Pulmonary effort is normal.     Breath sounds: Normal breath sounds. No rales.  Abdominal:     General: Bowel sounds are normal.     Palpations: Abdomen is soft.     Tenderness: There is no abdominal tenderness.   Musculoskeletal:        General: Normal range of motion.     Cervical back: Neck supple.  Lymphadenopathy:     Cervical: No cervical adenopathy.  Skin:    General: Skin is warm and dry.     Findings: No rash.  Neurological:     Mental Status: She is alert and oriented to person, place, and time.     Cranial Nerves: No cranial nerve deficit.     Deep Tendon Reflexes: Reflexes are normal and symmetric.  Psychiatric:        Mood and Affect: Mood normal.           Assessment &  Plan:  Preventative health care Assessment & Plan: Immunizations UTD. Pap smear due, she is scheduled with GYN Mammogram due, orders placed. Colonoscopy due, she declines today.   Discussed the importance of a healthy diet and regular exercise in order for weight loss, and to reduce the risk of further co-morbidity.  Exam stable. Labs pending.  Follow up in 1 year for repeat physical.    Migraine without aura and without status migrainosus, not intractable Assessment & Plan: Improving with better BP control.  Continue Nurtec 75 mg PRN. Refill provided.   Orders: -     Nurtec; Take 1 tablet by mouth at migraine onset as 1 dose as needed.  Dispense: 16 tablet; Refill: 0  Essential hypertension Assessment & Plan: Improved and at goal.  Continue lisinopril-hydrochlorothiazide 10-12.5 mg daily. BMP pending.  Orders: -     Lipid panel -     Basic metabolic panel  Hypothyroidism, unspecified type Assessment & Plan: Controlled. Reviewed thyroid level from last visit.  Remain off medication and continue to monitor.    GAD (generalized anxiety disorder) Assessment & Plan: Controlled.  No concerns today. Continue to monitor.    Screening mammogram for breast cancer -     3D Screening Mammogram, Left and Right; Future        Doreene Nest, NP

## 2023-05-25 NOTE — Patient Instructions (Signed)
Stop by the lab prior to leaving today. I will notify you of your results once received.   [facility].   Let me know when you are ready for the mammogram.  It was a pleasure to see you today!

## 2023-05-25 NOTE — Assessment & Plan Note (Signed)
Controlled.  No concerns today. Continue to monitor.  

## 2023-05-25 NOTE — Assessment & Plan Note (Signed)
Improving with better BP control.  Continue Nurtec 75 mg PRN. Refill provided.

## 2023-06-08 ENCOUNTER — Encounter: Payer: Self-pay | Admitting: Family Medicine

## 2023-06-08 ENCOUNTER — Other Ambulatory Visit (HOSPITAL_COMMUNITY)
Admission: RE | Admit: 2023-06-08 | Discharge: 2023-06-08 | Disposition: A | Discharge status: Home / Self Care | Payer: 59 | Source: Ambulatory Visit | Attending: Family Medicine | Admitting: Family Medicine

## 2023-06-08 ENCOUNTER — Ambulatory Visit (INDEPENDENT_AMBULATORY_CARE_PROVIDER_SITE_OTHER): Payer: 59 | Admitting: Family Medicine

## 2023-06-08 ENCOUNTER — Other Ambulatory Visit (HOSPITAL_COMMUNITY)
Admission: RE | Admit: 2023-06-08 | Discharge: 2023-06-08 | Disposition: A | Discharge status: Home / Self Care | Payer: BLUE CROSS/BLUE SHIELD | Source: Ambulatory Visit | Attending: Family Medicine | Admitting: Family Medicine

## 2023-06-08 VITALS — BP 122/81 | HR 94 | Ht 67.0 in | Wt 169.0 lb

## 2023-06-08 DIAGNOSIS — Z124 Encounter for screening for malignant neoplasm of cervix: Secondary | ICD-10-CM

## 2023-06-08 DIAGNOSIS — G43009 Migraine without aura, not intractable, without status migrainosus: Secondary | ICD-10-CM | POA: Diagnosis not present

## 2023-06-08 DIAGNOSIS — Z01419 Encounter for gynecological examination (general) (routine) without abnormal findings: Secondary | ICD-10-CM | POA: Diagnosis not present

## 2023-06-08 DIAGNOSIS — N83201 Unspecified ovarian cyst, right side: Secondary | ICD-10-CM | POA: Diagnosis not present

## 2023-06-08 DIAGNOSIS — L918 Other hypertrophic disorders of the skin: Secondary | ICD-10-CM

## 2023-06-08 DIAGNOSIS — N83202 Unspecified ovarian cyst, left side: Secondary | ICD-10-CM | POA: Diagnosis not present

## 2023-06-08 MED ORDER — NURTEC 75 MG PO TBDP
ORAL_TABLET | ORAL | 0 refills | Status: DC
Start: 2023-06-08 — End: 2023-06-12

## 2023-06-08 NOTE — Assessment & Plan Note (Signed)
[id number] - refilled her Nurtec to [facility] pharmacy

## 2023-06-08 NOTE — Progress Notes (Signed)
Patient presents for Annual.  Recently started B/P Meds notes improvement.    LMP: No periods with IUD  Last pap:  2020 WNL  Contraception: IUD: Mirena Mammogram:  scheduled for Next Friday. STD Screening: Declines Flu Vaccine : N/A  CC:  skin tag on left butt cheek area pt states has been there x 1 yr now and has gotten bigger no pain in the area would like removed if possible.  Fun Fact: Pt recently graduated  Ultrasound school.

## 2023-06-08 NOTE — Progress Notes (Signed)
Subjective:     Sharon Jones is a 47 y.o. female and is here for a comprehensive physical exam. The patient reports problems - skin tag, which is getting irritated, would like it removed. Has Mirena and does not get regular cycles.reports bilateral ovarian cysts, which cause pain.   The following portions of the patient's history were reviewed and updated as appropriate: allergies, current medications, past family history, past medical history, past social history, past surgical history, and problem list.  Review of Systems Pertinent items noted in HPI and remainder of comprehensive ROS otherwise negative.   Objective:  Chaperone present for exam   BP 122/81   Pulse 94   Ht 5\' 7"  (1.702 m)   Wt 169 lb (76.7 kg)   BMI 26.47 kg/m  General appearance: alert, cooperative, and appears stated age Head: Normocephalic, without obvious abnormality, atraumatic Neck: no adenopathy, supple, symmetrical, trachea midline, and thyroid not enlarged, symmetric, no tenderness/mass/nodules Lungs: clear to auscultation bilaterally Breasts: normal appearance, no masses or tenderness Heart: regular rate and rhythm, S1, S2 normal, no murmur, click, rub or gallop Abdomen: soft, non-tender; bowel sounds normal; no masses,  no organomegaly Pelvic: cervix normal in appearance, external genitalia normal, no adnexal masses or tenderness, no cervical motion tenderness, uterus normal size, shape, and consistency, and vagina normal without discharge skin tag in gluteal crease on left Extremities: extremities normal, atraumatic, no cyanosis or edema Pulses: 2+ and symmetric Skin: Skin color, texture, turgor normal. No rashes or lesions Lymph nodes: Cervical, supraclavicular, and axillary nodes normal. Neurologic: Grossly normal    Procedure: Patient identified, informed consent signed, copy in chart, time out performed.    Area cleansed with Alcohol.  Injected with 1% Lidocaine with Epi.  1 mL. Area  cleaned with Betadine and scalpel used to remove simple skin tag. Hemostasis obtained with Silver Nitrate.  Patient tolerated procedure well.    Assessment:    Healthy female exam.      Plan:   Problem List Items Addressed This Visit       Unprioritized   Migraine without aura and without status migrainosus, not intractable    99214 - refilled her Nurtec to [facility] pharmacy       Relevant Medications   Rimegepant Sulfate (NURTEC) 75 MG TBDP   Other Visit Diagnoses     Screening for malignant neoplasm of cervix    -  Primary   [phone] - pap today   Relevant Orders   Cytology - PAP   Encounter for gynecological examination without abnormal finding       99396 - sees PCP for annual labs. Mamogram scheduled.   Skin tag       99214 -  removed today, await path   Relevant Orders   Surgical pathology( [facility])   Cysts of both ovaries       99214 - check formal u/s.   Relevant Orders   US PELVIC COMPLETE WITH TRANSVAGINAL      Return in 1 year (on 06/07/2024).     See After Visit Summary for Counseling Recommendations

## 2023-06-08 NOTE — Patient Instructions (Signed)

## 2023-06-11 LAB — CYTOLOGY - PAP
Adequacy: ABSENT
Comment: NEGATIVE
Diagnosis: NEGATIVE
High risk HPV: NEGATIVE

## 2023-06-11 LAB — SURGICAL PATHOLOGY

## 2023-06-12 ENCOUNTER — Other Ambulatory Visit: Payer: Self-pay | Admitting: *Deleted

## 2023-06-12 ENCOUNTER — Other Ambulatory Visit (HOSPITAL_COMMUNITY): Payer: Self-pay

## 2023-06-12 DIAGNOSIS — G43009 Migraine without aura, not intractable, without status migrainosus: Secondary | ICD-10-CM

## 2023-06-12 MED ORDER — NURTEC 75 MG PO TBDP
ORAL_TABLET | ORAL | 0 refills | Status: DC
Start: 2023-06-12 — End: 2024-03-31
  Filled 2023-06-12: qty 16, 30d supply, fill #0
  Filled 2023-06-14: qty 16, 32d supply, fill #0

## 2023-06-12 NOTE — Progress Notes (Signed)
Resending to local pharmacy. 

## 2023-06-13 ENCOUNTER — Ambulatory Visit (HOSPITAL_COMMUNITY): Payer: 59

## 2023-06-13 ENCOUNTER — Other Ambulatory Visit (HOSPITAL_COMMUNITY): Payer: Self-pay

## 2023-06-13 ENCOUNTER — Encounter: Payer: Self-pay | Admitting: *Deleted

## 2023-06-14 ENCOUNTER — Other Ambulatory Visit (HOSPITAL_COMMUNITY): Payer: Self-pay

## 2023-06-15 ENCOUNTER — Ambulatory Visit
Admission: RE | Admit: 2023-06-15 | Discharge: 2023-06-15 | Disposition: A | Discharge status: Home / Self Care | Payer: BLUE CROSS/BLUE SHIELD | Source: Ambulatory Visit | Attending: Primary Care | Admitting: Primary Care

## 2023-06-15 DIAGNOSIS — Z1231 Encounter for screening mammogram for malignant neoplasm of breast: Secondary | ICD-10-CM | POA: Diagnosis not present

## 2023-08-06 ENCOUNTER — Other Ambulatory Visit: Payer: Self-pay | Admitting: Primary Care

## 2023-08-06 DIAGNOSIS — I10 Essential (primary) hypertension: Secondary | ICD-10-CM

## 2023-10-30 ENCOUNTER — Other Ambulatory Visit: Payer: Self-pay | Admitting: Obstetrics & Gynecology

## 2023-10-30 DIAGNOSIS — N3000 Acute cystitis without hematuria: Secondary | ICD-10-CM

## 2023-10-30 MED ORDER — PHENAZOPYRIDINE HCL 200 MG PO TABS
200.0000 mg | ORAL_TABLET | Freq: Three times a day (TID) | ORAL | 0 refills | Status: DC | PRN
Start: 2023-10-30 — End: 2024-04-11

## 2023-10-30 MED ORDER — CEFADROXIL 500 MG PO CAPS
500.0000 mg | ORAL_CAPSULE | Freq: Two times a day (BID) | ORAL | 0 refills | Status: AC
Start: 2023-10-30 — End: 2023-11-04

## 2023-10-30 NOTE — Progress Notes (Signed)
Patient called reporting UTI, desires medication. Prescriptions sent for Duricef and Pyridium, told to come in for further evaluation if symptoms worsen or do not improve.   Jaynie Collins, MD, FACOG Obstetrician & Gynecologist, [facility] for [employer], [facility]

## 2024-01-03 ENCOUNTER — Ambulatory Visit: Payer: 59 | Admitting: Primary Care

## 2024-01-04 ENCOUNTER — Ambulatory Visit: Payer: 59 | Admitting: Primary Care

## 2024-01-04 ENCOUNTER — Encounter: Payer: Self-pay | Admitting: Primary Care

## 2024-01-04 VITALS — BP 130/86 | HR 91 | Temp 97.0°F | Ht 67.0 in | Wt 167.0 lb

## 2024-01-04 DIAGNOSIS — D229 Melanocytic nevi, unspecified: Secondary | ICD-10-CM | POA: Insufficient documentation

## 2024-01-04 NOTE — Patient Instructions (Signed)
 You will either be contacted via phone regarding your referral to dermatology, or you may receive a letter on your MyChart portal from our referral team with instructions for scheduling an appointment. Please let us know if you have not been contacted by anyone within two weeks.  It was a pleasure to see you today!

## 2024-01-04 NOTE — Progress Notes (Signed)
Subjective:    Patient ID: Sharon Jones, female    DOB: 10-07-1976, 48 y.o.   MRN: [id number]  HPI  Sharon Jones is a very pleasant 48 y.o. female who presents today to discuss several nevi.  She has several moles to the bilateral anterior thighs for which she would like evaluated.   She had a lesion removed to the her left chest several years ago.  She has noticed the same type of lesion to her left thigh.  Previously following with dermatology for annual skin checks, would like to have this done.   She contacted [facility] health dermatology was told that she needed a referral.   Review of Systems  Skin:  Positive for color change. Negative for wound.       Multiple nevi         Past Medical History:  Diagnosis Date   Abnormal finding on Pap smear, ASCUS 12/05/1995   Anxiety    doesn't require meds   Cervicogenic headache 02/13/2019   Chronic neck pain    bulding disc   Chronic pain in left foot 09/17/2020   Dizziness    Graves disease    H/O chlamydia infection 03/09/1999   H/O echocardiogram    pt. consulted /w Dr. Elease Hashimoto, 2 + yrs. ago, had ECHO due to Graves disease.    Headache    otc med prn   Hypertension    doesn't require meds   Hypertrophy, vulva 10/08/2014   Nonallopathic lesion of cervical region 02/13/2019   Nonallopathic lesion of lumbosacral region 02/13/2019   Nonallopathic lesion of rib cage 02/13/2019   Nonallopathic lesion of sacral region 02/13/2019   Nonallopathic lesion of thoracic region 02/13/2019   PONV (postoperative nausea and vomiting)    Rash    arms and feet   SVD (spontaneous vaginal delivery)    x 1    Social History   Socioeconomic History   Marital status: Married    Spouse name: Not on file   Number of children: Not on file   Years of education: Not on file   Highest education level: Not on file  Occupational History   Not on file  Tobacco Use   Smoking status: Every Day    Types: E-cigarettes    Smokeless tobacco: Never   Tobacco comments:    quit in mar 2013-uses EC cigarette  Substance and Sexual Activity   Alcohol use: Not Currently   Drug use: No   Sexual activity: Yes    Birth control/protection: I.U.D.  Other Topics Concern   Not on file  Social History Narrative   Married.   1 child.   Works at [employer].   Enjoys spending time with her family.   Social Drivers of Corporate investment banker Strain: Not on file  Food Insecurity: Not on file  Transportation Needs: Not on file  Physical Activity: Not on file  Stress: Not on file  Social Connections: Not on file  Intimate Partner Violence: Not on file    Past Surgical History:  Procedure Laterality Date   ANTERIOR CERVICAL DECOMP/DISCECTOMY FUSION  10/02/2012   Procedure: ANTERIOR CERVICAL DECOMPRESSION/DISCECTOMY FUSION 1 LEVEL;  Surgeon: Emilee Hero, MD;  Location: [facility] OR;  Service: Orthopedics;  Laterality: Bilateral;  Anterior cervical decompression fusion, 5-6 with instrumentation, allograft.   BREAST ENHANCEMENT SURGERY     CERVICAL CONE BIOPSY     twice   LABIOPLASTY N/A 10/09/2014   Procedure: LABIAPLASTY;  Surgeon: Augusto Gamble  Bovard-Stuckert, MD;  Location: [facility] ORS;  Service: Gynecology;  Laterality: N/A;    Family History  Problem Relation Age of Onset   Hypertension Mother    Alcohol abuse Father    Alcohol abuse Maternal Uncle    Alcohol abuse Paternal Aunt    Arthritis Maternal Grandmother    Alcohol abuse Maternal Grandfather    Arthritis Maternal Grandfather    Arthritis Paternal Grandmother    Arthritis Paternal Grandfather     Allergies  Allergen Reactions   Ajovy [Fremanezumab-Vfrm] Swelling and Rash    Current Outpatient Medications on File Prior to Visit  Medication Sig Dispense Refill   lisinopril-hydrochlorothiazide (ZESTORETIC) 10-12.5 MG tablet TAKE 1 TABLET BY MOUTH DAILY FOR BLOOD PRESSURE 90 tablet 2   Rimegepant Sulfate (NURTEC) 75 MG TBDP Take 1 tablet by mouth  every  other day 16 tablet 0   phenazopyridine (PYRIDIUM) 200 MG tablet Take 1 tablet (200 mg total) by mouth 3 (three) times daily as needed for pain. (Patient not taking: Reported on 01/04/2024) 10 tablet 0   No current facility-administered medications on file prior to visit.    BP 130/86   Pulse 91   Temp (!) 97 F (36.1 C) (Temporal)   Ht 5\' 7"  (1.702 m)   Wt 167 lb (75.8 kg)   SpO2 98%   BMI 26.16 kg/m  Objective:   Physical Exam Cardiovascular:     Rate and Rhythm: Normal rate.  Pulmonary:     Effort: Pulmonary effort is normal.  Skin:    General: Skin is warm and dry.           Assessment & Plan:  Multiple nevi Assessment & Plan: Referral placed to dermatology.  Orders: -     Ambulatory referral to Dermatology        Doreene Nest, NP

## 2024-01-04 NOTE — Assessment & Plan Note (Signed)
Referral placed to dermatology

## 2024-01-24 ENCOUNTER — Ambulatory Visit: Payer: 59 | Admitting: Dermatology

## 2024-03-13 ENCOUNTER — Encounter: Payer: Self-pay | Admitting: Dermatology

## 2024-03-13 ENCOUNTER — Ambulatory Visit (INDEPENDENT_AMBULATORY_CARE_PROVIDER_SITE_OTHER): Payer: 59 | Admitting: Dermatology

## 2024-03-13 VITALS — BP 128/90 | HR 94

## 2024-03-13 DIAGNOSIS — D1801 Hemangioma of skin and subcutaneous tissue: Secondary | ICD-10-CM

## 2024-03-13 DIAGNOSIS — Z872 Personal history of diseases of the skin and subcutaneous tissue: Secondary | ICD-10-CM | POA: Diagnosis not present

## 2024-03-13 DIAGNOSIS — L821 Other seborrheic keratosis: Secondary | ICD-10-CM

## 2024-03-13 DIAGNOSIS — L814 Other melanin hyperpigmentation: Secondary | ICD-10-CM | POA: Diagnosis not present

## 2024-03-13 DIAGNOSIS — D229 Melanocytic nevi, unspecified: Secondary | ICD-10-CM

## 2024-03-13 DIAGNOSIS — L578 Other skin changes due to chronic exposure to nonionizing radiation: Secondary | ICD-10-CM

## 2024-03-13 DIAGNOSIS — W908XXA Exposure to other nonionizing radiation, initial encounter: Secondary | ICD-10-CM | POA: Diagnosis not present

## 2024-03-13 DIAGNOSIS — Z1283 Encounter for screening for malignant neoplasm of skin: Secondary | ICD-10-CM | POA: Diagnosis not present

## 2024-03-13 NOTE — Progress Notes (Unsigned)
   New Patient Visit   Subjective  Sharon Jones is a 48 y.o. female who presents for the following: Skin Cancer Screening and Full Body Skin Exam. Hx of Aks no hx or family hx of skin cancer.  The patient presents for Total-Body Skin Exam (TBSE) for skin cancer screening and mole check. The patient has spots, moles and lesions to be evaluated, some may be new or changing and the patient may have concern these could be cancer.    The following portions of the chart were reviewed this encounter and updated as appropriate: medications, allergies, medical history  Review of Systems:  No other skin or systemic complaints except as noted in HPI or Assessment and Plan.  Objective  Well appearing patient in no apparent distress; mood and affect are within normal limits.  A full examination was performed including scalp, head, eyes, ears, nose, lips, neck, chest, axillae, abdomen, back, buttocks, bilateral upper extremities, bilateral lower extremities, hands, feet, fingers, toes, fingernails, and toenails. All findings within normal limits unless otherwise noted below.   Relevant physical exam findings are noted in the Assessment and Plan.    Assessment & Plan   SKIN CANCER SCREENING PERFORMED TODAY.  ACTINIC DAMAGE - Chronic condition, secondary to cumulative UV/sun exposure - diffuse scaly erythematous macules with underlying dyspigmentation - Recommend daily broad spectrum sunscreen SPF 30+ to sun-exposed areas, reapply every 2 hours as needed.  - Staying in the shade or wearing long sleeves, sun glasses (UVA+UVB protection) and wide brim hats (4-inch brim around the entire circumference of the hat) are also recommended for sun protection.  - Call for new or changing lesions.  LENTIGINES, SEBORRHEIC KERATOSES, HEMANGIOMAS - Benign normal skin lesions - Benign-appearing - Call for any changes  MELANOCYTIC NEVI - Tan-brown and/or pink-flesh-colored symmetric macules and  papules - Benign appearing on exam today - Observation - Call clinic for new or changing moles - Recommend daily use of broad spectrum spf 30+ sunscreen to sun-exposed areas.          No follow-ups on file.  I, Manual Meier, Surg Tech III, am acting as scribe for Gwenith Daily, MD.   Documentation: I have reviewed the above documentation for accuracy and completeness, and I agree with the above.  Gwenith Daily, MD

## 2024-03-13 NOTE — Patient Instructions (Signed)
 Skin Education :   I counseled the patient regarding the following: Sun screen (SPF 30 or greater) should be applied during peak UV exposure (between 10am and 2pm) and reapplied after exercise or swimming.  The ABCDEs of melanoma were reviewed with the patient, and the importance of monthly self-examination of moles was emphasized. Should any moles change in shape or color, or itch, bleed or burn, pt will contact our office for evaluation sooner then their interval appointment.  Plan: Sunscreen Recommendations I recommended a broad spectrum sunscreen with a SPF of 30 or higher. I explained that SPF 30 sunscreens block approximately 97 percent of the sun's harmful rays. Sunscreens should be applied at least 15 minutes prior to expected sun exposure and then every 2 hours after that as long as sun exposure continues. If swimming or exercising sunscreen should be reapplied every 45 minutes to an hour after getting wet or sweating. One ounce, or the equivalent of a shot glass full of sunscreen, is adequate to protect the skin not covered by a bathing suit. I also recommended a lip balm with a sunscreen as well. Sun protective clothing can be used in lieu of sunscreen but must be worn the entire time you are exposed to the sun's rays.  Important Information  Due to recent changes in healthcare laws, you may see results of your pathology and/or laboratory studies on MyChart before the doctors have had a chance to review them. We understand that in some cases there may be results that are confusing or concerning to you. Please understand that not all results are received at the same time and often the doctors may need to interpret multiple results in order to provide you with the best plan of care or course of treatment. Therefore, we ask that you please give [place] 2 business days to thoroughly review all your results before contacting the office for clarification. Should we see a critical lab result, you will be  contacted sooner.   If You Need Anything After Your Visit  If you have any questions or concerns for your doctor, please call our main line at [phone] If no one answers, please leave a voicemail as directed and we will return your call as soon as possible. Messages left after 4 pm will be answered the following business day.   You may also send [place] a message via MyChart. We typically respond to MyChart messages within 1-2 business days.  For prescription refills, please ask your pharmacy to contact our office. Our fax number is [phone].  If you have an urgent issue when the clinic is closed that cannot wait until the next business day, you can page your doctor at the number below.    Please note that while we do our best to be available for urgent issues outside of office hours, we are not available 24/7.   If you have an urgent issue and are unable to reach [place], you may choose to seek medical care at your doctor's office, retail clinic, urgent care center, or emergency room.  If you have a medical emergency, please immediately call 911 or go to the emergency department. In the event of inclement weather, please call our main line at [phone] for an update on the status of any delays or closures.  Dermatology Medication Tips: Please keep the boxes that topical medications come in in order to help keep track of the instructions about where and how to use these. Pharmacies typically print the medication instructions  only on the boxes and not directly on the medication tubes.   If your medication is too expensive, please contact our office at [phone] or send [place] a message through MyChart.   We are unable to tell what your co-pay for medications will be in advance as this is different depending on your insurance coverage. However, we may be able to find a substitute medication at lower cost or fill out paperwork to get insurance to cover a needed medication.   If a prior  authorization is required to get your medication covered by your insurance company, please allow [place] 1-2 business days to complete this process.  Drug prices often vary depending on where the prescription is filled and some pharmacies may offer cheaper prices.  The website www.goodrx.com contains coupons for medications through different pharmacies. The prices here do not account for what the cost may be with help from insurance (it may be cheaper with your insurance), but the website can give you the price if you did not use any insurance.  - You can print the associated coupon and take it with your prescription to the pharmacy.  - You may also stop by our office during regular business hours and pick up a GoodRx coupon card.  - If you need your prescription sent electronically to a different pharmacy, notify our office through [facility] or by phone at [phone]

## 2024-03-31 ENCOUNTER — Other Ambulatory Visit: Payer: Self-pay | Admitting: *Deleted

## 2024-03-31 ENCOUNTER — Other Ambulatory Visit (HOSPITAL_COMMUNITY): Payer: Self-pay

## 2024-03-31 DIAGNOSIS — G43009 Migraine without aura, not intractable, without status migrainosus: Secondary | ICD-10-CM

## 2024-03-31 MED ORDER — NURTEC 75 MG PO TBDP
ORAL_TABLET | ORAL | 4 refills | Status: AC
  Filled 2024-03-31: qty 8, 15d supply, fill #0
  Filled 2024-04-02: qty 16, 30d supply, fill #0
  Filled 2024-08-13: qty 16, 30d supply, fill #1
  Filled 2025-01-07: qty 16, 30d supply, fill #2

## 2024-04-01 ENCOUNTER — Encounter: Payer: Self-pay | Admitting: *Deleted

## 2024-04-02 ENCOUNTER — Other Ambulatory Visit: Payer: Self-pay | Admitting: *Deleted

## 2024-04-02 ENCOUNTER — Other Ambulatory Visit (HOSPITAL_COMMUNITY): Payer: Self-pay

## 2024-04-02 DIAGNOSIS — Z1231 Encounter for screening mammogram for malignant neoplasm of breast: Secondary | ICD-10-CM

## 2024-04-10 ENCOUNTER — Other Ambulatory Visit: Payer: Self-pay

## 2024-04-10 DIAGNOSIS — I10 Essential (primary) hypertension: Secondary | ICD-10-CM

## 2024-04-11 MED ORDER — LISINOPRIL-HYDROCHLOROTHIAZIDE 10-12.5 MG PO TABS: 1.0000 | ORAL_TABLET | Freq: Every day | ORAL | 0 refills | Status: DC

## 2024-04-11 NOTE — Telephone Encounter (Signed)
 Patient is due for CPE/follow up in late June 2025, this will be required prior to any further refills.  Please schedule, thank you!

## 2024-04-11 NOTE — Telephone Encounter (Signed)
 Spoke to pt, scheduled cpe for 05/29/24

## 2024-05-29 ENCOUNTER — Encounter: Admitting: Primary Care

## 2024-06-12 ENCOUNTER — Encounter: Payer: Self-pay | Admitting: Family Medicine

## 2024-06-12 ENCOUNTER — Other Ambulatory Visit (HOSPITAL_COMMUNITY)
Admission: RE | Admit: 2024-06-12 | Discharge: 2024-06-12 | Disposition: A | Discharge status: Home / Self Care | Source: Ambulatory Visit | Attending: Family Medicine | Admitting: Family Medicine

## 2024-06-12 ENCOUNTER — Ambulatory Visit (INDEPENDENT_AMBULATORY_CARE_PROVIDER_SITE_OTHER): Admitting: Family Medicine

## 2024-06-12 VITALS — BP 119/73 | HR 94 | Wt 171.0 lb

## 2024-06-12 DIAGNOSIS — Z124 Encounter for screening for malignant neoplasm of cervix: Secondary | ICD-10-CM | POA: Diagnosis not present

## 2024-06-12 DIAGNOSIS — Z01419 Encounter for gynecological examination (general) (routine) without abnormal findings: Secondary | ICD-10-CM

## 2024-06-12 DIAGNOSIS — Z1331 Encounter for screening for depression: Secondary | ICD-10-CM | POA: Diagnosis not present

## 2024-06-12 DIAGNOSIS — R7989 Other specified abnormal findings of blood chemistry: Secondary | ICD-10-CM | POA: Diagnosis not present

## 2024-06-12 DIAGNOSIS — N898 Other specified noninflammatory disorders of vagina: Secondary | ICD-10-CM | POA: Diagnosis not present

## 2024-06-12 NOTE — Progress Notes (Signed)
 Subjective:     Sharon Jones is a 48 y.o. female and is here for a comprehensive physical exam. The patient reports problems - vaginal dryness during intercourse. Has IUD x 5 years and notes occasional spotting only.   The following portions of the patient's history were reviewed and updated as appropriate: allergies, current medications, past family history, past medical history, past social history, past surgical history, and problem list.  Review of Systems Pertinent items noted in HPI and remainder of comprehensive ROS otherwise negative.   Objective:  Chaperone present for exam   BP 119/73   Pulse 94   Wt 171 lb (77.6 kg)   BMI 26.78 kg/m  General appearance: alert, cooperative, and appears stated age Head: Normocephalic, without obvious abnormality, atraumatic Neck: no adenopathy, no JVD, supple, symmetrical, trachea midline, and thyroid  not enlarged, symmetric, no tenderness/mass/nodules Lungs: clear to auscultation bilaterally Breasts: normal appearance, no masses or tenderness Heart: regular rate and rhythm, S1, S2 normal, no murmur, click, rub or gallop Abdomen: soft, non-tender; bowel sounds normal; no masses,  no organomegaly Pelvic: cervix normal in appearance, external genitalia normal, no adnexal masses or tenderness, no cervical motion tenderness, uterus normal size, shape, and consistency, and vagina normal without discharge IUD strings noted Extremities: extremities normal, atraumatic, no cyanosis or edema Pulses: 2+ and symmetric Skin: Skin color, texture, turgor normal. No rashes or lesions Lymph nodes: Cervical, supraclavicular, and axillary nodes normal. Neurologic: Grossly normal    Assessment:    Healthy female exam.      Plan:  Encounter for gynecological examination without abnormal finding - Mammogram orders are in - Plan: CBC, Comprehensive metabolic panel with GFR, TSH, Hemoglobin A1c, Lipid panel  Screening for cervical cancer - Plan:  Cytology - PAP( [facility])  Vaginal dryness - check FSH. having occasional hot flashes as well. given Uberlube and other lubrication - Plan: Follicle stimulating hormone  Return in 1 year (on 06/12/2025).    See After Visit Summary for Counseling Recommendations

## 2024-06-12 NOTE — Progress Notes (Signed)
 Patient presents for Annual.  LMP: spotting every now and then. Last pap: Date: 06/08/2023 Contraception: IUD: Mirena  Mammogram: 06/15/23 [place]  STD Screening: Declines   CC: Annual/None

## 2024-06-13 ENCOUNTER — Ambulatory Visit: Payer: Self-pay | Admitting: Family Medicine

## 2024-06-13 LAB — CBC
Hematocrit: 43.3 % (ref 34.0–46.6)
Hemoglobin: 14.4 g/dL (ref 11.1–15.9)
MCH: 31.4 pg (ref 26.6–33.0)
MCHC: 33.3 g/dL (ref 31.5–35.7)
MCV: 94 fL (ref 79–97)
Platelets: 291 x10E3/uL (ref 150–450)
RBC: 4.59 x10E6/uL (ref 3.77–5.28)
RDW: 12.2 % (ref 11.7–15.4)
WBC: 4.2 x10E3/uL (ref 3.4–10.8)

## 2024-06-13 LAB — COMPREHENSIVE METABOLIC PANEL WITH GFR
ALT: 12 IU/L (ref 0–32)
AST: 21 IU/L (ref 0–40)
Albumin: 5 g/dL — ABNORMAL HIGH (ref 3.9–4.9)
Alkaline Phosphatase: 43 IU/L — ABNORMAL LOW (ref 44–121)
BUN/Creatinine Ratio: 20 (ref 9–23)
BUN: 17 mg/dL (ref 6–24)
Bilirubin Total: 0.3 mg/dL (ref 0.0–1.2)
CO2: 19 mmol/L — ABNORMAL LOW (ref 20–29)
Calcium: 10 mg/dL (ref 8.7–10.2)
Chloride: 101 mmol/L (ref 96–106)
Creatinine, Ser: 0.87 mg/dL (ref 0.57–1.00)
Globulin, Total: 2.7 g/dL (ref 1.5–4.5)
Glucose: 95 mg/dL (ref 70–99)
Potassium: 4.6 mmol/L (ref 3.5–5.2)
Sodium: 138 mmol/L (ref 134–144)
Total Protein: 7.7 g/dL (ref 6.0–8.5)
eGFR: 83 mL/min/1.73 (ref >59)

## 2024-06-13 LAB — LIPID PANEL
Chol/HDL Ratio: 3.5 ratio (ref 0.0–4.4)
Cholesterol, Total: 207 mg/dL — ABNORMAL HIGH (ref 100–199)
HDL: 60 mg/dL (ref >39)
LDL Chol Calc (NIH): 133 mg/dL — ABNORMAL HIGH (ref 0–99)
Triglycerides: 76 mg/dL (ref 0–149)
VLDL Cholesterol Cal: 14 mg/dL (ref 5–40)

## 2024-06-13 LAB — HEMOGLOBIN A1C
Est. average glucose Bld gHb Est-mCnc: 103 mg/dL
Hgb A1c MFr Bld: 5.2 % (ref 4.8–5.6)

## 2024-06-13 LAB — TSH: TSH: 4.96 u[IU]/mL — ABNORMAL HIGH (ref 0.450–4.500)

## 2024-06-13 LAB — FOLLICLE STIMULATING HORMONE: FSH: 48.3 m[IU]/mL

## 2024-06-17 LAB — T4, FREE: Free T4: 1.07 ng/dL (ref 0.82–1.77)

## 2024-06-17 LAB — T3, FREE: T3, Free: 3 pg/mL (ref 2.0–4.4)

## 2024-06-17 LAB — SPECIMEN STATUS REPORT

## 2024-06-17 LAB — CYTOLOGY - PAP
Adequacy: ABSENT
Comment: NEGATIVE
Diagnosis: NEGATIVE
High risk HPV: NEGATIVE

## 2024-07-03 ENCOUNTER — Ambulatory Visit
Admission: RE | Admit: 2024-07-03 | Discharge: 2024-07-03 | Disposition: A | Discharge status: Home / Self Care | Source: Ambulatory Visit | Attending: Family Medicine | Admitting: Family Medicine

## 2024-07-03 DIAGNOSIS — Z1231 Encounter for screening mammogram for malignant neoplasm of breast: Secondary | ICD-10-CM | POA: Insufficient documentation

## 2024-07-14 ENCOUNTER — Other Ambulatory Visit: Payer: Self-pay | Admitting: *Deleted

## 2024-07-14 DIAGNOSIS — N83201 Unspecified ovarian cyst, right side: Secondary | ICD-10-CM

## 2024-07-16 ENCOUNTER — Telehealth: Payer: Self-pay | Admitting: Family Medicine

## 2024-07-16 NOTE — Telephone Encounter (Signed)
 Call from [facility] about imaging orders for patient from [facility] Spoke with Hadassah from [facility]  Reviewed chart Radiology asking if Dr Fredirick intended follicle counting Clarified that [facility] showed postmenopausal status and image was ordered for history of ovarian cysts  Suzen Maryan Masters, MD

## 2024-07-17 ENCOUNTER — Ambulatory Visit: Payer: Self-pay | Admitting: Family Medicine

## 2024-07-17 ENCOUNTER — Ambulatory Visit
Admission: RE | Admit: 2024-07-17 | Discharge: 2024-07-17 | Disposition: A | Discharge status: Home / Self Care | Source: Ambulatory Visit | Attending: Family Medicine | Admitting: Family Medicine

## 2024-07-17 DIAGNOSIS — N83202 Unspecified ovarian cyst, left side: Secondary | ICD-10-CM | POA: Diagnosis not present

## 2024-07-17 DIAGNOSIS — N83201 Unspecified ovarian cyst, right side: Secondary | ICD-10-CM

## 2024-07-28 ENCOUNTER — Other Ambulatory Visit (HOSPITAL_COMMUNITY): Payer: Self-pay

## 2024-07-28 ENCOUNTER — Other Ambulatory Visit: Payer: Self-pay | Admitting: *Deleted

## 2024-07-28 MED ORDER — PHENAZOPYRIDINE HCL 200 MG PO TABS
200.0000 mg | ORAL_TABLET | Freq: Three times a day (TID) | ORAL | 2 refills | Status: DC | PRN
  Filled 2024-07-28: qty 10, 4d supply, fill #0

## 2024-07-28 MED ORDER — CEFADROXIL 500 MG PO CAPS
500.0000 mg | ORAL_CAPSULE | Freq: Two times a day (BID) | ORAL | 0 refills | Status: DC
Start: 2024-07-28 — End: 2024-08-29
  Filled 2024-07-28: qty 10, 5d supply, fill #0

## 2024-08-01 ENCOUNTER — Ambulatory Visit: Payer: Self-pay | Admitting: Family Medicine

## 2024-08-13 ENCOUNTER — Other Ambulatory Visit: Payer: Self-pay | Admitting: Primary Care

## 2024-08-13 DIAGNOSIS — I10 Essential (primary) hypertension: Secondary | ICD-10-CM

## 2024-08-13 NOTE — Telephone Encounter (Signed)
 I called and left a voicemail for patient to be scheduled.

## 2024-08-13 NOTE — Telephone Encounter (Signed)
 Patient is due for CPE or follow up, this will be required prior to any further refills.  Please schedule, thank you!

## 2024-08-29 ENCOUNTER — Ambulatory Visit (INDEPENDENT_AMBULATORY_CARE_PROVIDER_SITE_OTHER): Admitting: Family Medicine

## 2024-08-29 DIAGNOSIS — N83202 Unspecified ovarian cyst, left side: Secondary | ICD-10-CM | POA: Diagnosis not present

## 2024-08-29 DIAGNOSIS — N83201 Unspecified ovarian cyst, right side: Secondary | ICD-10-CM

## 2024-08-29 NOTE — Assessment & Plan Note (Signed)
 Independently reviewed images. R>L cysts, left with daughter. Generally simple and present for at least 2 years. Per rads, recheck at 3 months. Check CA125. If WNL, repeat u/s at 3, 6 months. If stable and not bothering her, would watch with serial u/s. If growing or causing sx's, then consider removal.

## 2024-08-29 NOTE — Progress Notes (Signed)
   Subjective:    Patient ID: Sharon Jones is a 48 y.o. 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 Notes they seem to have enlarged. Has pain on right at times.  Review of Systems  Constitutional:  Negative for chills and fever.  Respiratory:  Negative for shortness of breath.   Cardiovascular:  Negative for chest pain.  Gastrointestinal:  Negative for abdominal pain, nausea and vomiting.  Genitourinary:  Negative for dysuria.  Skin:  Negative for rash.      Objective:    There were no vitals taken for this visit. Physical Exam Exam conducted with a chaperone present.  Constitutional:      Appearance: She is well-developed.  HENT:     Head: Normocephalic and atraumatic.  Eyes:     General: No scleral icterus. Neck:     Thyroid : No thyromegaly.  Cardiovascular:     Rate and Rhythm: Normal rate and regular rhythm.  Pulmonary:     Effort: Pulmonary effort is normal.  Abdominal:     General: There is no distension.     Palpations: Abdomen is soft.     Tenderness: There is no abdominal tenderness.  Musculoskeletal:     Cervical back: Normal range of motion.  Skin:    General: Skin is warm and dry.  Neurological:     Mental Status: She is alert.         Assessment & Plan:   Problem List Items Addressed This Visit       Unprioritized   Bilateral ovarian cysts - Primary   Independently reviewed images. R>L cysts, left with daughter. Generally simple and present for at least 2 years. Per rads, recheck at 3 months. Check CA125. If WNL, repeat u/s at 3, 6 months. If stable and not bothering her, would watch with serial u/s. If growing or causing sx's, then consider removal.       Relevant Orders   CA 125   US  PELVIC COMPLETE WITH TRANSVAGINAL     Return in 6 months (on 02/26/2025) for needs U/S.  Sharon GORMAN Birk, MD 08/29/2024 11:35 AM

## 2024-08-29 NOTE — Progress Notes (Signed)
Here to discuss [place] results

## 2024-08-30 ENCOUNTER — Ambulatory Visit: Payer: Self-pay | Admitting: Family Medicine

## 2024-08-30 LAB — CA 125: Cancer Antigen (CA) 125: 9.9 U/mL (ref 0.0–38.1)

## 2024-09-11 ENCOUNTER — Ambulatory Visit (INDEPENDENT_AMBULATORY_CARE_PROVIDER_SITE_OTHER): Admitting: Primary Care

## 2024-09-11 ENCOUNTER — Encounter: Payer: Self-pay | Admitting: Primary Care

## 2024-09-11 ENCOUNTER — Other Ambulatory Visit (HOSPITAL_COMMUNITY): Payer: Self-pay

## 2024-09-11 VITALS — BP 122/86 | HR 98 | Temp 97.5°F | Ht 67.0 in | Wt 168.0 lb

## 2024-09-11 DIAGNOSIS — E039 Hypothyroidism, unspecified: Secondary | ICD-10-CM | POA: Diagnosis not present

## 2024-09-11 DIAGNOSIS — K5909 Other constipation: Secondary | ICD-10-CM

## 2024-09-11 DIAGNOSIS — Z1211 Encounter for screening for malignant neoplasm of colon: Secondary | ICD-10-CM | POA: Diagnosis not present

## 2024-09-11 DIAGNOSIS — I10 Essential (primary) hypertension: Secondary | ICD-10-CM | POA: Diagnosis not present

## 2024-09-11 DIAGNOSIS — G43009 Migraine without aura, not intractable, without status migrainosus: Secondary | ICD-10-CM | POA: Diagnosis not present

## 2024-09-11 DIAGNOSIS — E785 Hyperlipidemia, unspecified: Secondary | ICD-10-CM | POA: Diagnosis not present

## 2024-09-11 DIAGNOSIS — Z0000 Encounter for general adult medical examination without abnormal findings: Secondary | ICD-10-CM

## 2024-09-11 DIAGNOSIS — F411 Generalized anxiety disorder: Secondary | ICD-10-CM

## 2024-09-11 MED ORDER — HYDROXYZINE HCL 10 MG PO TABS
10.0000 mg | ORAL_TABLET | Freq: Two times a day (BID) | ORAL | 0 refills | Status: DC | PRN
  Filled 2024-09-11: qty 60, 15d supply, fill #0

## 2024-09-11 NOTE — Assessment & Plan Note (Signed)
 No concerns today

## 2024-09-11 NOTE — Assessment & Plan Note (Signed)
Controlled.  Continue Nurtec 75 mg every other day.

## 2024-09-11 NOTE — Assessment & Plan Note (Signed)
 Uncontrolled due to recent events.  Discussed options for treatment, we decided to start hydroxyzine 10 to 20 mg twice daily as needed for anxiety/sleep.  Drowsiness precautions provided. Prescription sent to pharmacy.

## 2024-09-11 NOTE — Progress Notes (Addendum)
 Subjective:    Patient ID: Sharon Jones, female    DOB: 03-Nov-1976, 48 y.o.   MRN: [id number]  Sharon Jones is a very pleasant 48 y.o. female who presents today for complete physical and follow up of chronic conditions.  Over the last 1 week she found out that her husband was having an affair with another person. Since then she's felt increased anxiety, is tearful often, feeling shaky, on the verge of a panic attack, difficulty sleeping, difficulty with focusing at work.   Immunizations: -Tetanus: Completed in 2022 -Influenza: Will get at work.   Diet: Fair diet.  Exercise: No regular exercise.  Eye exam: Completes annually  Dental exam: Completes semi-annually    Pap Smear: Completed in 2025 Mammogram: Completed in August 2025  Colonoscopy: Never completed, declined last year.  Prefers Cologuard.   BP Readings from Last 3 Encounters:  09/11/24 122/86  06/12/24 119/73  03/13/24 (!) 128/90       Review of Systems  Constitutional:  Negative for unexpected weight change.  HENT:  Negative for rhinorrhea.   Respiratory:  Negative for cough and shortness of breath.   Cardiovascular:  Negative for chest pain.  Gastrointestinal:  Negative for constipation and diarrhea.  Genitourinary:  Negative for difficulty urinating.  Musculoskeletal:  Negative for arthralgias and myalgias.  Skin:  Negative for rash.  Allergic/Immunologic: Negative for environmental allergies.  Neurological:  Negative for dizziness, numbness and headaches.  Psychiatric/Behavioral:  The patient is nervous/anxious.        See HPI         Past Medical History:  Diagnosis Date   Abnormal finding on Pap smear, ASCUS 12/05/1995   Anxiety    doesn't require meds   Cervicogenic headache 02/13/2019   Chronic neck pain    bulding disc   Chronic pain in left foot 09/17/2020   Dizziness    Graves disease    H/O chlamydia infection 03/09/1999   H/O echocardiogram    pt. consulted /w  Dr. Alveta, 2 + yrs. ago, had ECHO due to Graves disease.    Headache    otc med prn   Hypertension    doesn't require meds   Hypertrophy, vulva 10/08/2014   Nonallopathic lesion of cervical region 02/13/2019   Nonallopathic lesion of lumbosacral region 02/13/2019   Nonallopathic lesion of rib cage 02/13/2019   Nonallopathic lesion of sacral region 02/13/2019   Nonallopathic lesion of thoracic region 02/13/2019   PONV (postoperative nausea and vomiting)    Rash    arms and feet   SVD (spontaneous vaginal delivery)    x 1    Social History   Socioeconomic History   Marital status: Married    Spouse name: Not on file   Number of children: Not on file   Years of education: Not on file   Highest education level: Not on file  Occupational History   Not on file  Tobacco Use   Smoking status: Every Day    Types: E-cigarettes   Smokeless tobacco: Never   Tobacco comments:    quit in mar 2013-uses EC cigarette  Substance and Sexual Activity   Alcohol use: Not Currently   Drug use: No   Sexual activity: Yes    Birth control/protection: I.U.D.  Other Topics Concern   Not on file  Social History Narrative   Married.   1 child.   Works at [employer].   Enjoys spending time with her family.   Social Drivers of  Health   Financial Resource Strain: Not on file  Food Insecurity: Not on file  Transportation Needs: Not on file  Physical Activity: Not on file  Stress: Not on file  Social Connections: Not on file  Intimate Partner Violence: Not on file    Past Surgical History:  Procedure Laterality Date   ANTERIOR CERVICAL DECOMP/DISCECTOMY FUSION  10/02/2012   Procedure: ANTERIOR CERVICAL DECOMPRESSION/DISCECTOMY FUSION 1 LEVEL;  Surgeon: Oneil Rodgers Priestly, MD;  Location: [facility] OR;  Service: Orthopedics;  Laterality: Bilateral;  Anterior cervical decompression fusion, 5-6 with instrumentation, allograft.   BREAST ENHANCEMENT SURGERY     CERVICAL CONE BIOPSY     twice    LABIOPLASTY N/A 10/09/2014   Procedure: LABIAPLASTY;  Surgeon: Ezzie Buba, MD;  Location: [facility] ORS;  Service: Gynecology;  Laterality: N/A;    Family History  Problem Relation Age of Onset   Hypertension Mother    Alcohol abuse Father    Alcohol abuse Maternal Uncle    Alcohol abuse Paternal Aunt    Arthritis Maternal Grandmother    Alcohol abuse Maternal Grandfather    Arthritis Maternal Grandfather    Arthritis Paternal Grandmother    Arthritis Paternal Grandfather     Allergies  Allergen Reactions   Ajovy [Fremanezumab-Vfrm] Swelling and Rash    Current Outpatient Medications on File Prior to Visit  Medication Sig Dispense Refill   lisinopril -hydrochlorothiazide  (ZESTORETIC ) 10-12.5 MG tablet TAKE 1 TABLET BY MOUTH DAILY FOR BLOOD PRESSURE 30 tablet 0   Rimegepant Sulfate  (NURTEC) 75 MG TBDP Take 1 tablet by mouth  every other day 16 tablet 4   No current facility-administered medications on file prior to visit.    BP 122/86   Pulse 98   Temp (!) 97.5 F (36.4 C) (Temporal)   Ht 5' 7 (1.702 m)   Wt 168 lb (76.2 kg)   SpO2 98%   BMI 26.31 kg/m  Objective:   Physical Exam HENT:     Right Ear: Tympanic membrane and ear canal normal.     Left Ear: Tympanic membrane and ear canal normal.  Eyes:     Pupils: Pupils are equal, round, and reactive to light.  Cardiovascular:     Rate and Rhythm: Normal rate and regular rhythm.  Pulmonary:     Effort: Pulmonary effort is normal.     Breath sounds: Normal breath sounds.  Abdominal:     General: Bowel sounds are normal.     Palpations: Abdomen is soft.     Tenderness: There is no abdominal tenderness.  Musculoskeletal:        General: Normal range of motion.     Cervical back: Neck supple.  Skin:    General: Skin is warm and dry.  Neurological:     Mental Status: She is alert and oriented to person, place, and time.     Cranial Nerves: No cranial nerve deficit.     Deep Tendon Reflexes:     Reflex  Scores:      Patellar reflexes are 2+ on the right side and 2+ on the left side. Psychiatric:     Comments: Tearful during visit     Physical Exam        Assessment & Plan:  Preventative health care Assessment & Plan: Immunizations UTD. Pap smear UTD. Mammogram due, orders placed. Colonoscopy due.  She declines colonoscopy but opts for Cologuard.  Orders placed.  Discussed the importance of a healthy diet and regular exercise in order for weight loss,  and to reduce the risk of further co-morbidity.  Exam stable. Labs reviewed.  Follow up in 1 year for repeat physical.    Migraine without aura and without status migrainosus, not intractable Assessment & Plan: Controlled.  Continue Nurtec 75 mg every other day.   Essential hypertension Assessment & Plan: Controlled.  Continue lisinopril -hydrochlorothiazide  10-12.5 mg daily. Labs reviewed from July 2025   Chronic constipation Assessment & Plan: No concerns today.   Hypothyroidism, unspecified type Assessment & Plan: Controlled. Reviewed TSH from July 2025. Remain off treatment.   GAD (generalized anxiety disorder) Assessment & Plan: Uncontrolled due to recent events.  Discussed options for treatment, we decided to start hydroxyzine 10 to 20 mg twice daily as needed for anxiety/sleep.  Drowsiness precautions provided. Prescription sent to pharmacy.  Orders: -     hydrOXYzine HCl; Take 1-2 tablets (10-20 mg total) by mouth 2 (two) times daily as needed for anxiety.  Dispense: 60 tablet; Refill: 0  Screening for colon cancer -     Cologuard  Hyperlipidemia LDL goal <100 Assessment & Plan: Above goal on recent labs and prior labs.  Given significant family history of heart disease we agreed to obtain a CT coronary calcium scan. Orders placed.  Orders: -     CT CARDIAC SCORING (SELF PAY ONLY); Future    Assessment and Plan Assessment & Plan         Sharon MARLA Gaskins,  NP     History of Present Illness

## 2024-09-11 NOTE — Assessment & Plan Note (Addendum)
 Immunizations UTD. Pap smear UTD. Mammogram due, orders placed. Colonoscopy due.  She declines colonoscopy but opts for Cologuard.  Orders placed.  Discussed the importance of a healthy diet and regular exercise in order for weight loss, and to reduce the risk of further co-morbidity.  Exam stable. Labs reviewed.  Follow up in 1 year for repeat physical.

## 2024-09-11 NOTE — Assessment & Plan Note (Addendum)
 Controlled. Reviewed TSH from July 2025. Remain off treatment.

## 2024-09-11 NOTE — Addendum Note (Signed)
 Addended by: Oliveah Zwack K on: 09/11/2024 09:14 AM   Modules accepted: Orders

## 2024-09-11 NOTE — Patient Instructions (Signed)
 You may take hydroxyzine up to twice daily as needed for anxiety/sleep.  This may cause drowsiness.  It was a pleasure to see you today!

## 2024-09-11 NOTE — Assessment & Plan Note (Signed)
 Above goal on recent labs and prior labs.  Given significant family history of heart disease we agreed to obtain a CT coronary calcium scan. Orders placed.

## 2024-09-11 NOTE — Assessment & Plan Note (Addendum)
 Controlled.  Continue lisinopril -hydrochlorothiazide  10-12.5 mg daily. Labs reviewed from July 2025

## 2024-09-18 ENCOUNTER — Other Ambulatory Visit

## 2024-09-19 ENCOUNTER — Other Ambulatory Visit

## 2024-09-19 DIAGNOSIS — R5383 Other fatigue: Secondary | ICD-10-CM

## 2024-09-20 ENCOUNTER — Ambulatory Visit: Payer: Self-pay | Admitting: Family Medicine

## 2024-09-20 ENCOUNTER — Other Ambulatory Visit: Payer: Self-pay | Admitting: Primary Care

## 2024-09-20 DIAGNOSIS — I10 Essential (primary) hypertension: Secondary | ICD-10-CM

## 2024-09-20 LAB — T3, FREE: T3, Free: 2.7 pg/mL (ref 2.0–4.4)

## 2024-09-20 LAB — T4, FREE: Free T4: 1.12 ng/dL (ref 0.82–1.77)

## 2024-09-20 LAB — TSH: TSH: 3.22 u[IU]/mL (ref 0.450–4.500)

## 2024-09-25 ENCOUNTER — Ambulatory Visit: Payer: Self-pay | Admitting: Primary Care

## 2024-09-25 LAB — COLOGUARD: COLOGUARD: NEGATIVE

## 2024-10-13 ENCOUNTER — Other Ambulatory Visit: Payer: Self-pay

## 2024-10-13 ENCOUNTER — Other Ambulatory Visit (HOSPITAL_COMMUNITY): Payer: Self-pay

## 2024-10-13 MED FILL — Lisinopril & Hydrochlorothiazide Tab 10-12.5 MG: ORAL | 90 days supply | Qty: 90 | Fill #0 | Status: AC

## 2024-12-09 ENCOUNTER — Other Ambulatory Visit: Payer: Self-pay | Admitting: Primary Care

## 2024-12-09 ENCOUNTER — Other Ambulatory Visit (HOSPITAL_COMMUNITY): Payer: Self-pay

## 2024-12-09 DIAGNOSIS — F411 Generalized anxiety disorder: Secondary | ICD-10-CM

## 2024-12-09 MED ORDER — HYDROXYZINE HCL 10 MG PO TABS
10.0000 mg | ORAL_TABLET | Freq: Two times a day (BID) | ORAL | 0 refills | Status: AC | PRN
  Filled 2024-12-09: qty 180, 45d supply, fill #0

## 2024-12-09 NOTE — Telephone Encounter (Signed)
 Message from pt via pharmacy:   Can you please increse the aount of tablets on the refill please. I have found this medication to be very helpful with my anxiety. Thanks

## 2025-01-07 MED FILL — Lisinopril & Hydrochlorothiazide Tab 10-12.5 MG: ORAL | 90 days supply | Qty: 90 | Fill #1 | Status: AC

## 2025-01-08 ENCOUNTER — Other Ambulatory Visit (HOSPITAL_COMMUNITY): Payer: Self-pay

## 2025-01-08 ENCOUNTER — Telehealth (HOSPITAL_COMMUNITY): Payer: Self-pay

## 2025-01-08 NOTE — Telephone Encounter (Signed)
 Where is this request coming from? [facility]  Medication: Nurtec 75mg  Prior authorization required? Yes If YES, on primary or secondary insurance? Primary Comments:

## 2025-01-09 ENCOUNTER — Other Ambulatory Visit (HOSPITAL_COMMUNITY): Payer: Self-pay

## 2025-01-09 ENCOUNTER — Telehealth (HOSPITAL_COMMUNITY): Payer: Self-pay

## 2025-01-09 NOTE — Telephone Encounter (Signed)
 Pharmacy Patient Advocate Encounter   Received notification from Pt Calls Messages that prior authorization for Nurtec 75MG  dispersible tablets  is required/requested.   Insurance verification completed.   The patient is insured through [facility].   Per test claim: PA required; PA submitted to above mentioned insurance via Latent Key/confirmation #/EOC [id number] Status is pending

## 2025-01-09 NOTE — Telephone Encounter (Signed)
 PA request has been Received. New Encounter has been or will be created for follow up. For additional info see Pharmacy Prior Auth telephone encounter from 01/09/25.

## 2025-01-09 NOTE — Telephone Encounter (Signed)
 Pharmacy Patient Advocate Encounter  Received notification from [facility] that Prior Authorization for Nurtec 75MG  dispersible tablets  has been APPROVED from 01/09/25 to 01/08/26. Ran test claim, Copay is $0. This test claim was processed through [facility]- copay amounts may vary at other pharmacies due to pharmacy/plan contracts, or as the patient moves through the different stages of their insurance plan.   PA #/Case ID/Reference #: [id number]
